# Patient Record
Sex: Female | Born: 1970 | Race: White | Hispanic: No | Smoking: Heavy tobacco smoker
Health system: Southern US, Community
[De-identification: ages and names within clinical notes are randomized; demographics above are authoritative.]

## PROBLEM LIST (undated history)

## (undated) DIAGNOSIS — L88 Pyoderma gangrenosum: Secondary | ICD-10-CM

## (undated) DIAGNOSIS — D649 Anemia, unspecified: Secondary | ICD-10-CM

## (undated) HISTORY — DX: Anemia, unspecified: D64.9

## (undated) HISTORY — PX: TUBAL LIGATION: SHX77

## (undated) HISTORY — PX: KNEE SURGERY: SHX244

---

## 2003-03-28 ENCOUNTER — Encounter: Payer: Self-pay | Admitting: Obstetrics and Gynecology

## 2003-03-28 ENCOUNTER — Inpatient Hospital Stay (HOSPITAL_COMMUNITY): Admission: AD | Admit: 2003-03-28 | Discharge: 2003-03-31 | Payer: Self-pay | Admitting: Obstetrics and Gynecology

## 2004-07-01 ENCOUNTER — Ambulatory Visit: Payer: Self-pay | Admitting: Family Medicine

## 2004-07-07 ENCOUNTER — Ambulatory Visit (HOSPITAL_COMMUNITY): Admission: RE | Admit: 2004-07-07 | Discharge: 2004-07-07 | Payer: Self-pay | Admitting: *Deleted

## 2004-07-07 ENCOUNTER — Ambulatory Visit: Payer: Self-pay | Admitting: Obstetrics & Gynecology

## 2004-07-21 ENCOUNTER — Ambulatory Visit: Payer: Self-pay | Admitting: *Deleted

## 2004-07-22 ENCOUNTER — Ambulatory Visit: Payer: Self-pay | Admitting: Family Medicine

## 2004-07-22 ENCOUNTER — Inpatient Hospital Stay (HOSPITAL_COMMUNITY): Admission: AD | Admit: 2004-07-22 | Discharge: 2004-07-22 | Payer: Self-pay | Admitting: Obstetrics & Gynecology

## 2004-07-28 ENCOUNTER — Ambulatory Visit: Payer: Self-pay | Admitting: *Deleted

## 2004-07-28 ENCOUNTER — Ambulatory Visit (HOSPITAL_COMMUNITY): Admission: RE | Admit: 2004-07-28 | Discharge: 2004-07-28 | Payer: Self-pay | Admitting: *Deleted

## 2004-08-04 ENCOUNTER — Ambulatory Visit: Payer: Self-pay | Admitting: *Deleted

## 2004-08-11 ENCOUNTER — Ambulatory Visit (HOSPITAL_COMMUNITY): Admission: RE | Admit: 2004-08-11 | Discharge: 2004-08-11 | Payer: Self-pay | Admitting: Obstetrics & Gynecology

## 2004-08-11 ENCOUNTER — Ambulatory Visit: Payer: Self-pay | Admitting: Obstetrics & Gynecology

## 2004-08-18 ENCOUNTER — Ambulatory Visit: Payer: Self-pay | Admitting: Family Medicine

## 2004-08-24 ENCOUNTER — Inpatient Hospital Stay (HOSPITAL_COMMUNITY): Admission: AD | Admit: 2004-08-24 | Discharge: 2004-08-27 | Payer: Self-pay | Admitting: *Deleted

## 2004-09-08 ENCOUNTER — Inpatient Hospital Stay: Admission: AD | Admit: 2004-09-08 | Discharge: 2004-09-08 | Payer: Self-pay | Admitting: *Deleted

## 2005-09-22 IMAGING — US US OB DETAIL+14 WK
1 series · 13 of 28 positions shown · non-contrast
Comparison: none

CLINICAL DATA: 29 week 6 day gestational age by LMP.  Advanced maternal age.

[Series 1: us ob detail+14 wk · 0.33mm/px · 13 of 419 slices shown]
[im 16/419]
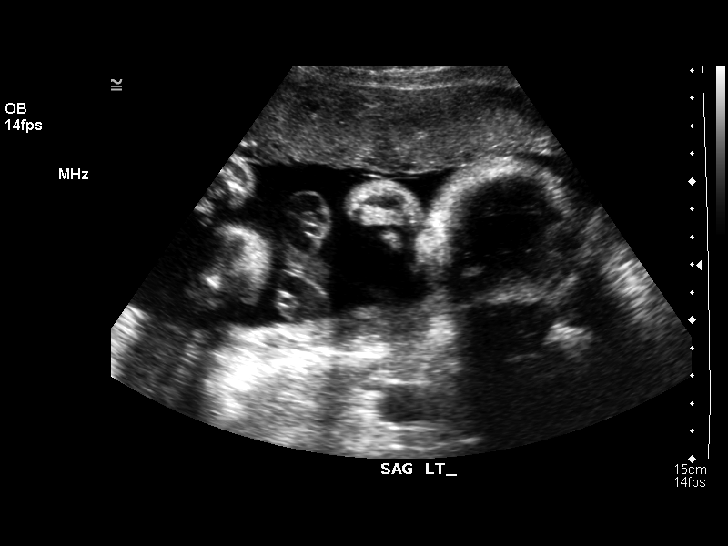
[im 47/419]
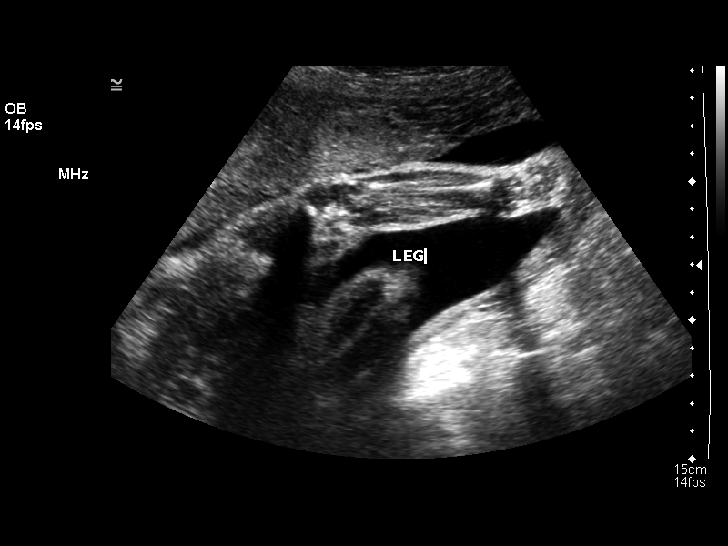
[im 78/419]
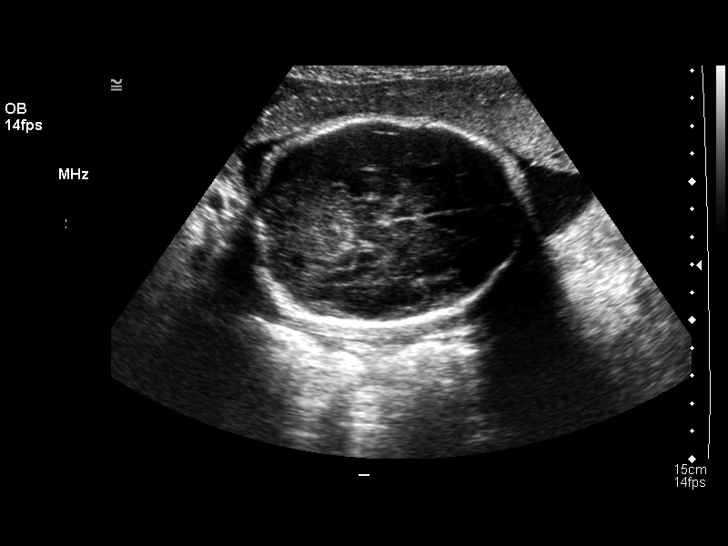
[im 109/419]
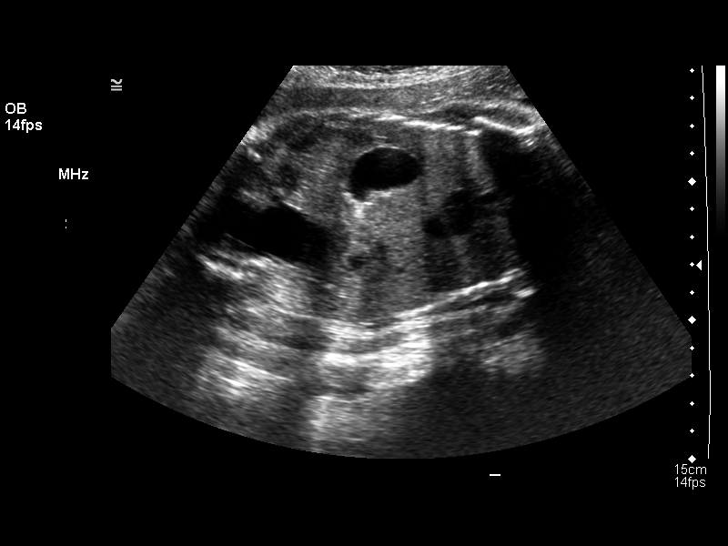
[im 140/419]
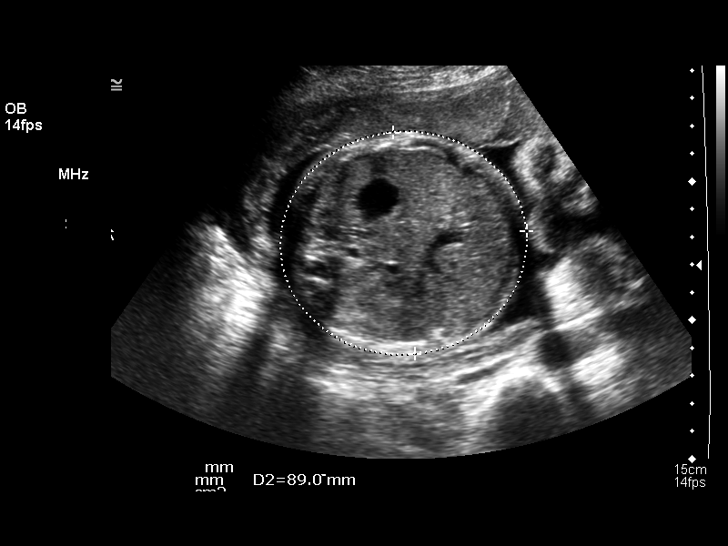
[im 171/419]
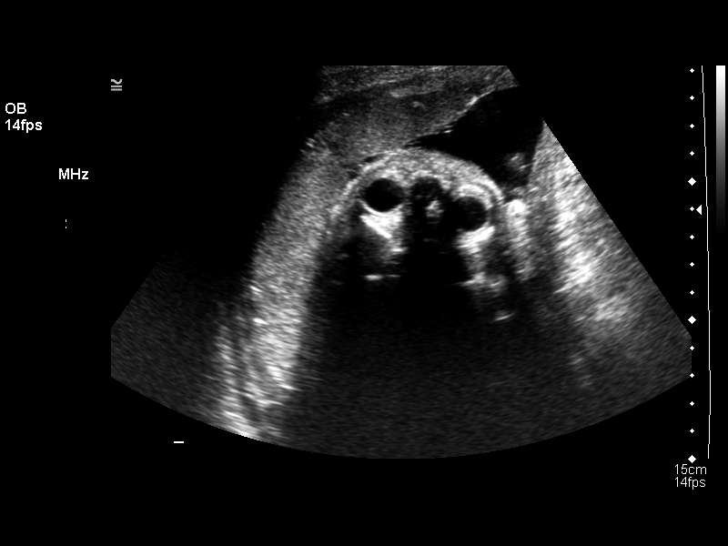
[im 217/419]
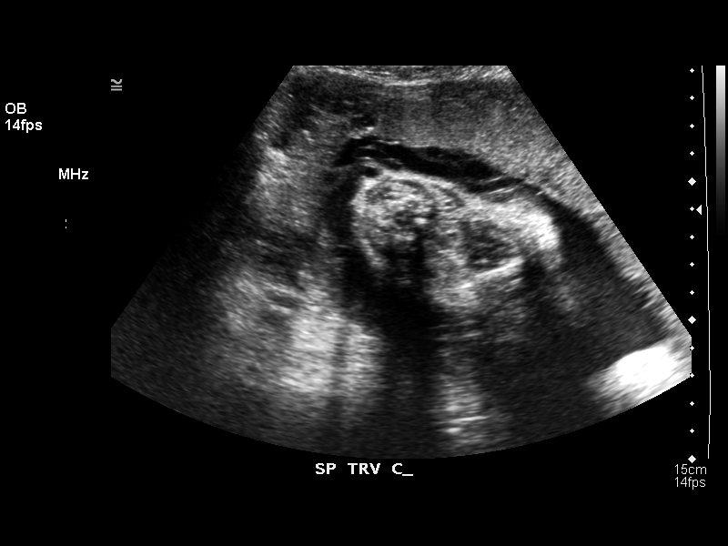
[im 248/419]
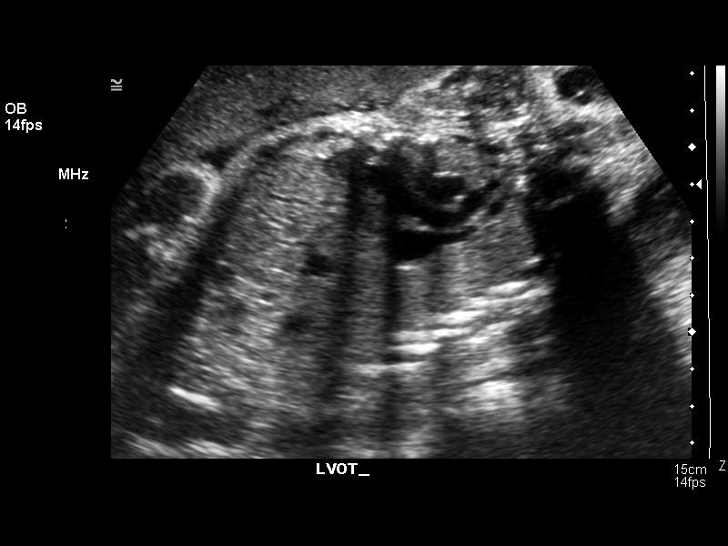
[im 279/419]
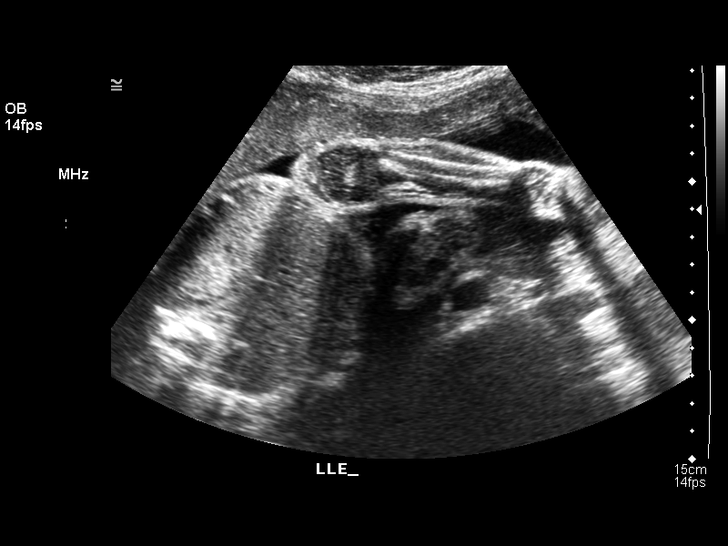
[im 310/419]
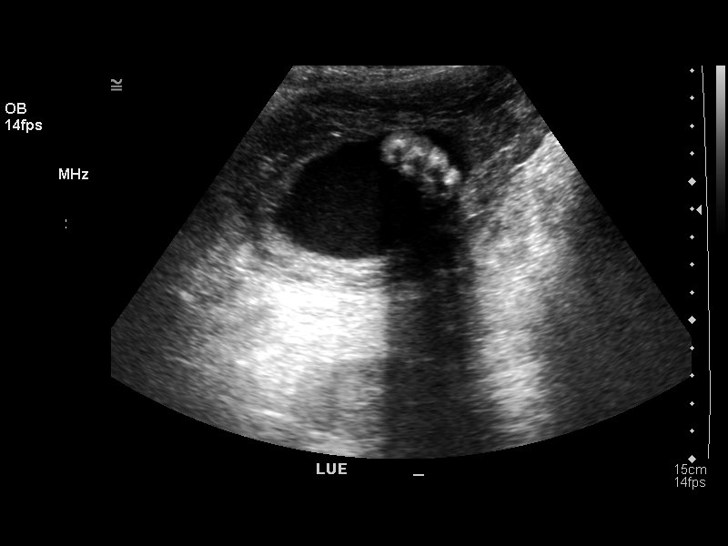
[im 341/419]
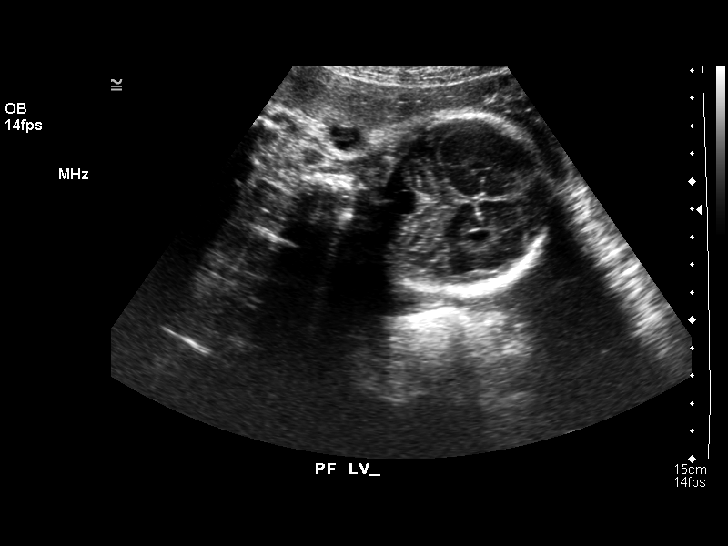
[im 372/419]
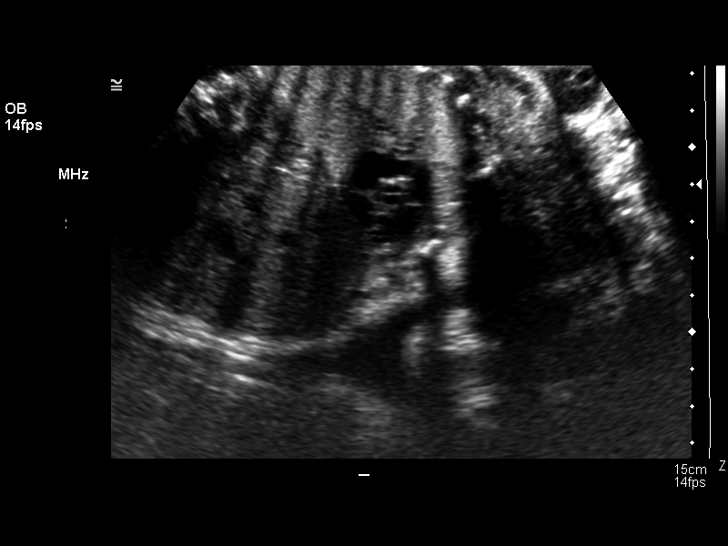
[im 403/419]
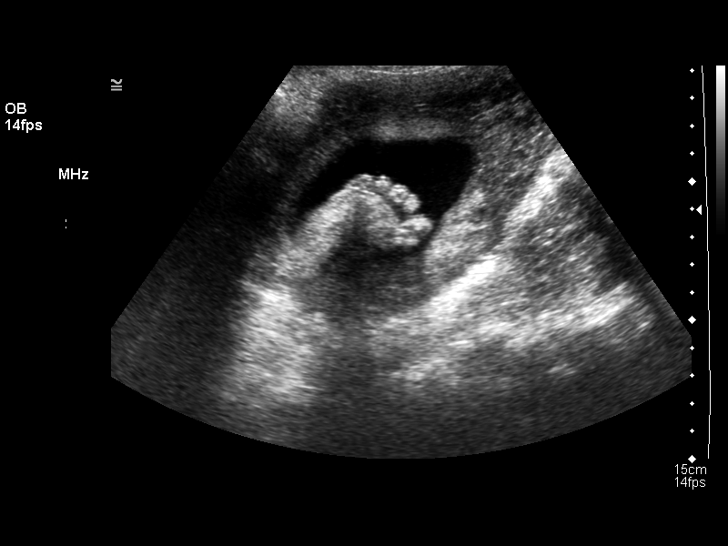

[13 of 28 positions shown; findings below may reference images not displayed]

DETAILED OBSTETRICAL ULTRASOUND:

Number of fetuses:  1
Heart rate:    141
Movement:    Yes
Breathing:    No
Presentation:   Cephalic
Placental Location:   Anterior
Placental Grade:    II
Placenta Previa:    No
Amniotic Fluid (Subjective):    Normal
Amniotic Fluid (Objective):    14.0 cm AFI (5th - 95th%ile = 9.0 - 23.4 cm for
30 wks)

BPD:       7.6 cm  30 w 5 d
HC:         29.4 cm   32 w 3 d
AC:         26.9 cm   31 w 1 d
FL:          5.8 cm  30 w 2 d
HL:          5.0 cm  29 w 2 d
Mean GA:      31 w 1 d

EFW:    5901 g (H) 75th - 90th%ile (2122 - 8501 g) For 30 wks

FETAL ANATOMY
Lateral Ventricle:      Visualized 
Thalamus/CSP:       Visualized 
Posterior Fossa:      Visualized 
Nuchal Region:       N/A
Spine:                   Visualized 
4-Chamber Heart:       Visualized 
Stomach:            Visualized 
3-Vessel Cord:     Visualized 
Abd Cord Insert:     Visualized 
Kidneys:             Visualized 
Bladder:              Visualized 
Extremities:          Visualized 

Additional Anatomy Visualized:   LVOT, RVOT, upper lip, orbits, profile,
diaphragm, heel, 5th digit, ductal arch, aortic arch, and male genitalia.  

Comment:  A nasal bone is visualized.  No sonographic markers for Down syndrome
are identified.

MATERNAL FINDINGS
Cervix:   3.8 cm Transabdominally
IMPRESSION: 1.  Single living intrauterine fetus with mean gestational age of 31 weeks 1 day
and sonographic EDC of 09/07/04.  This is slightly greater than 1 week ahead of
LMP, however initial estimates of gestational age in the third trimester are
less accurate.  

2.  No evidence of fetal anatomic abnormality.  No sonographic markers for Down
syndrome identified.

## 2005-10-13 IMAGING — US US OB FOLLOW-UP
1 series · 17 of 28 positions shown · non-contrast
Comparison: none

CLINICAL DATA: Decreased fetal movement.  Assess growth, AFI and BPP.

[Series 1: us ob re-eval · 17 of 56 slices shown]
[im 1/56]
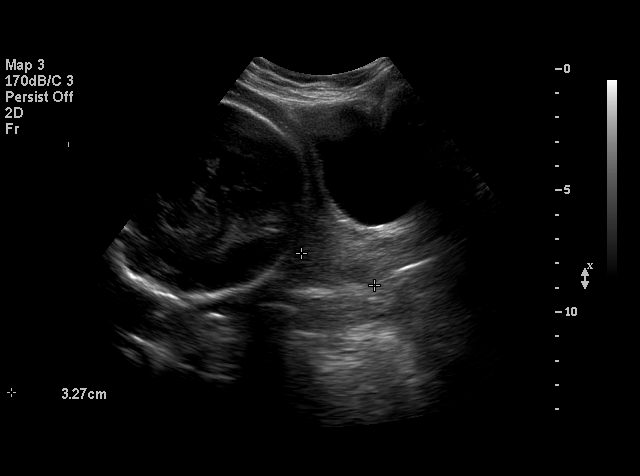
[im 5/56]
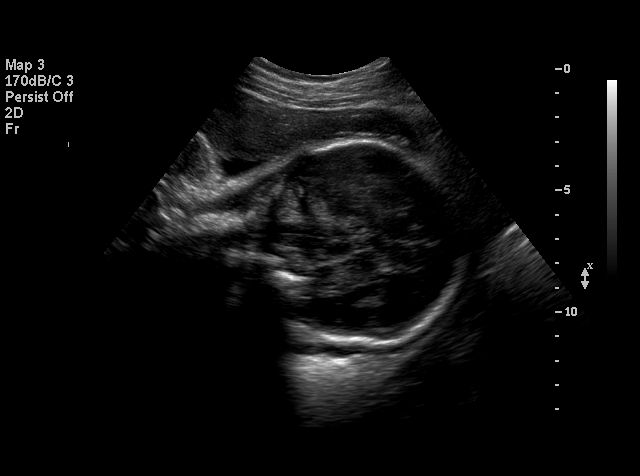
[im 9/56]
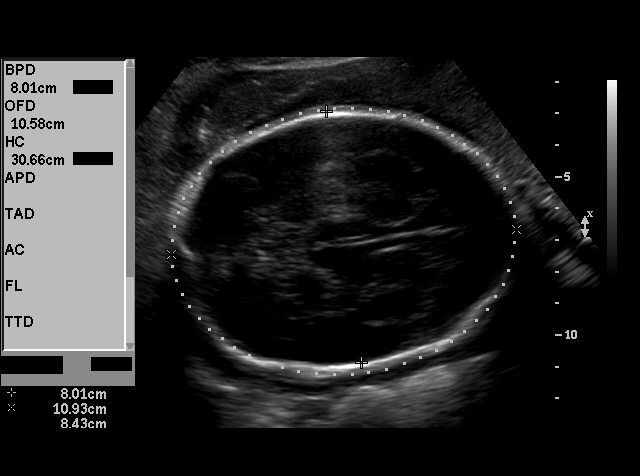
[im 11/56]
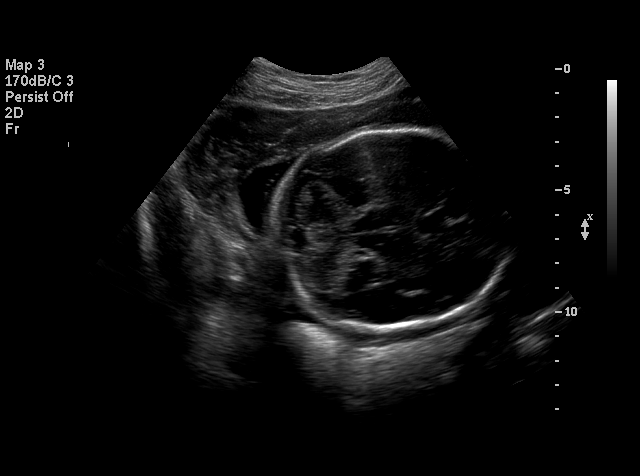
[im 15/56]
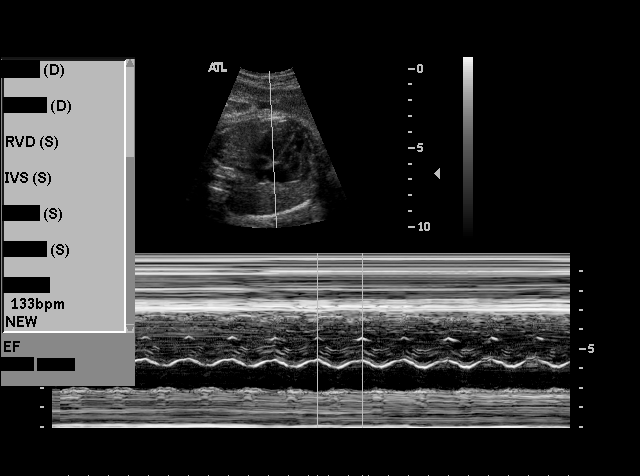
[im 19/56]
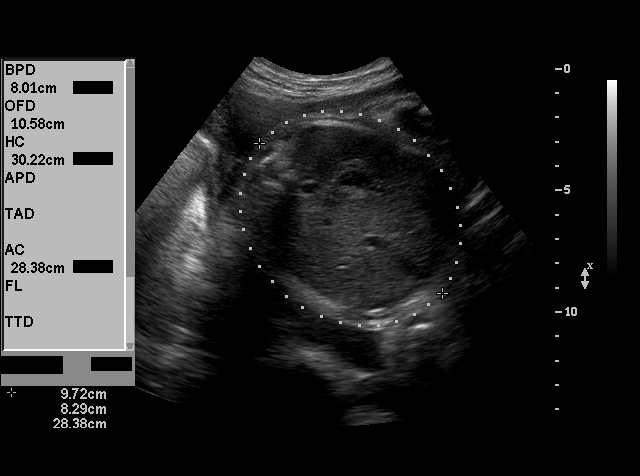
[im 21/56]
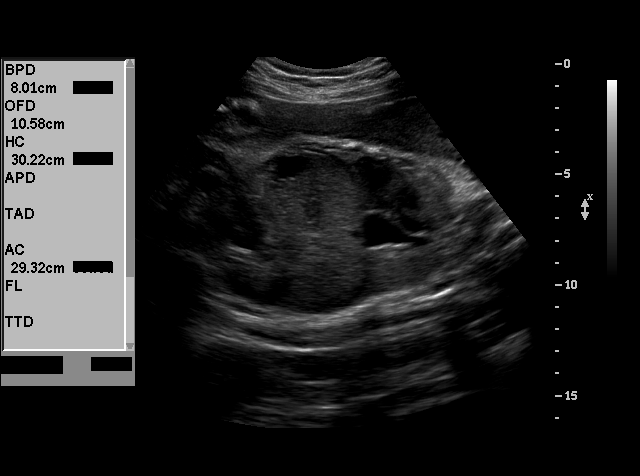
[im 25/56]
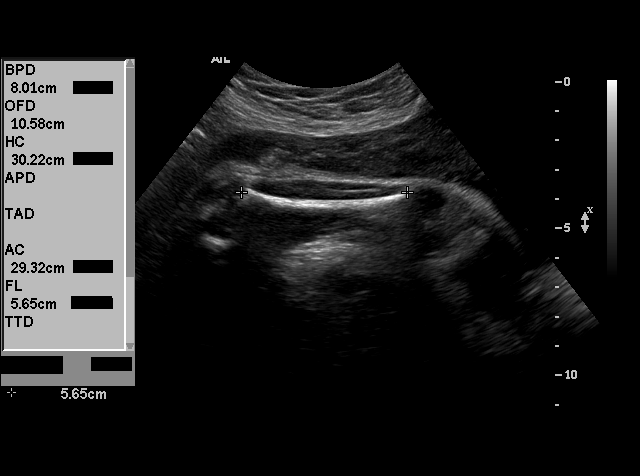
[im 29/56]
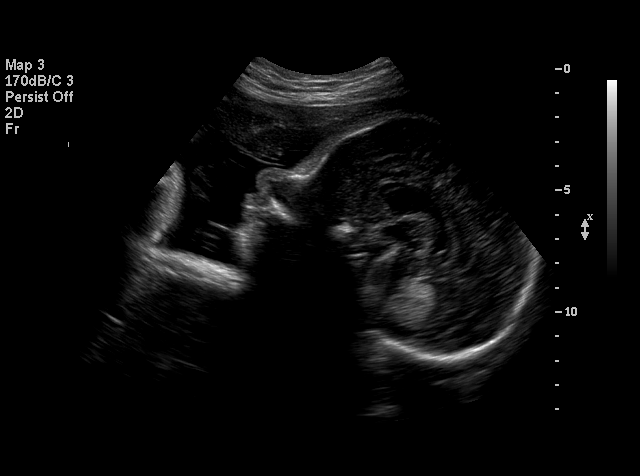
[im 31/56]
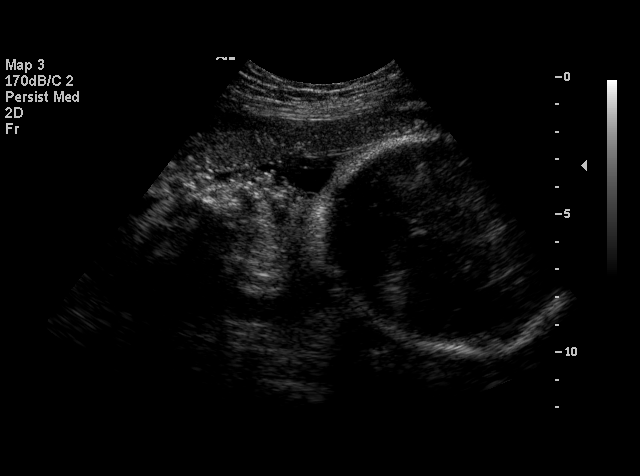
[im 35/56]
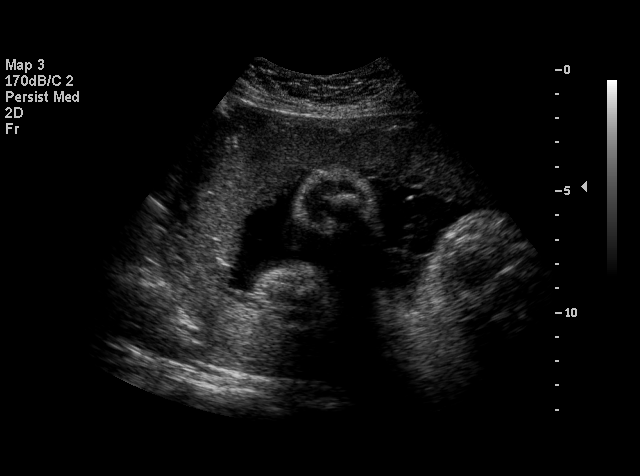
[im 37/56]
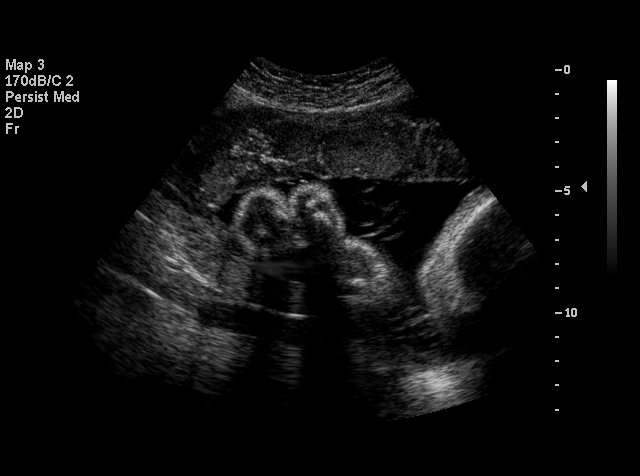
[im 41/56]
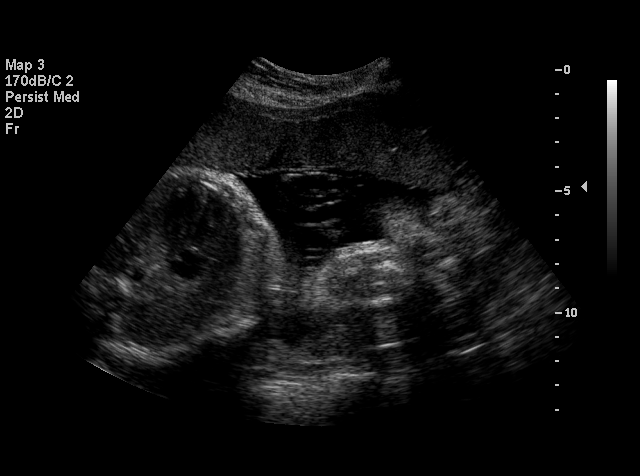
[im 45/56]
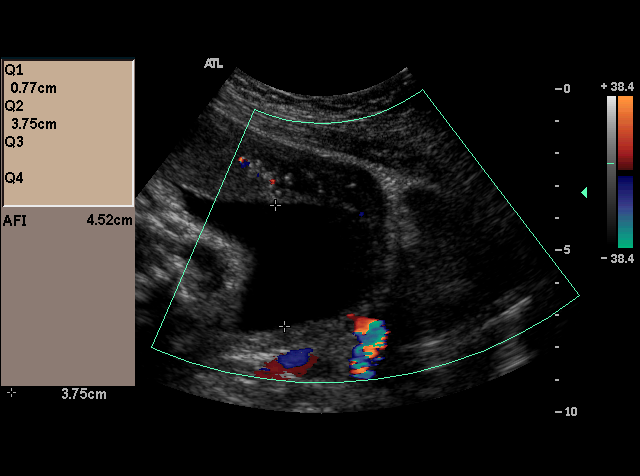
[im 47/56]
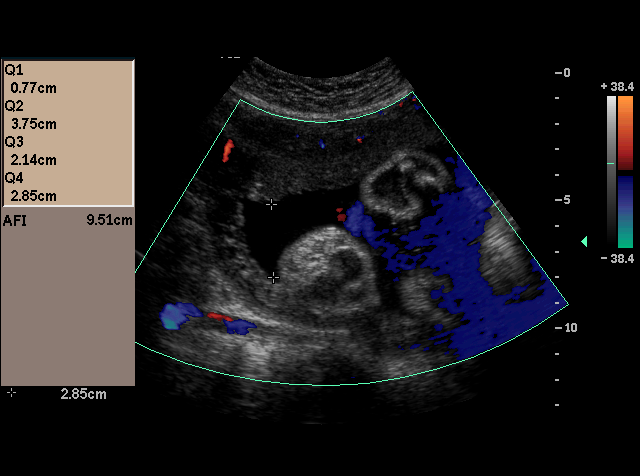
[im 51/56]
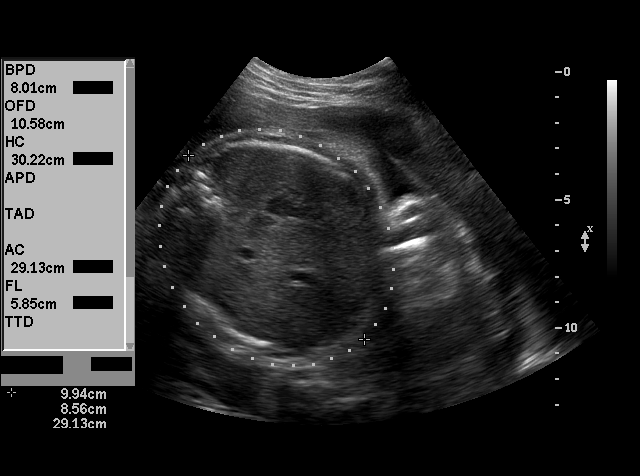
[im 56/56]
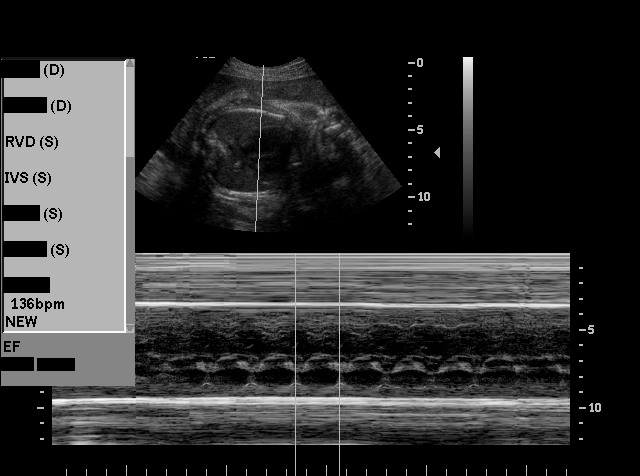

[17 of 28 positions shown; findings below may reference images not displayed]

OBSTETRICAL ULTRASOUND RE-EVALUATION:
 Number of Fetuses:  1
 Heart Rate:  133
 Movement:  Yes
 Breathing:  Yes
 Presentation:  Cephalic
 Placental Location:  Anterior
 Grade:  II
 Previa:  No
 Amniotic Fluid (subjective):  Normal
 Amniotic Fluid (objective):  10.4 cm AFI (5th -95th%ile = 8.3 – 24.5 cm for 33 wks)

 FETAL BIOMETRY
 BPD:  8.0 cm   32 w 1 d
 HC:  30.4 cm   33 w 6 d
 AC:  28.9 cm   33 w 0 d
 FL:   5.8 cm   30 w 3 d

 Mean GA:  32 w 3 d
 Assigned GA:  32 w 6 d

 EFW:  9803 g (H) 50th – 75th%ile (9893 – 1037 g) For 33 wks

 FETAL ANATOMY
 Lateral Ventricles:  Visualized 
 Thalami/CSP:  Visualized 
 Posterior Fossa:  Visualized 
 Nuchal Region:  Previously seen 
 Spine:  Previously seen 
 4 Chamber Heart on Left:  Visualized 
 Stomach on Left:  Visualized 
 3 Vessel Cord:  Previously seen 
 Cord Insertion Site:  Previously seen 
 Kidneys:  Visualized 
 Bladder:  Visualized   
 Extremities:  Previously seen 

 MATERNAL UTERINE AND ADNEXAL FINDINGS
 Cervix:  3.4 cm Transabdominally
 BIOPHYSICAL PROFILE

 Movement:  2    Time:  30 minutes
 Breathing:  2
 Tone:  2
 Amniotic Fluid:  2

 Total Score:  8
IMPRESSION: 1.  Single living intrauterine fetus in cephalic presentation with subjectively and quantitatively normal amniotic fluid volume.  Estimated mean gestational age by ultrasound today is 32 weeks 3 days which is well correlated with the reported assigned gestational age of 32 weeks 6 days.  Best estimated fetal weight has dropped from the 75th – 90th percentile to 50th – 75th percentile.  Repeat sonographic assessment in four weeks may prove helpful to exclude lagging fetal growth.
 2.  Biophysical profile score is [DATE] after 30 minutes of observation.

## 2005-10-27 IMAGING — US US OB FOLLOW-UP
1 series · 18 of 28 positions shown · non-contrast
Comparison: none

CLINICAL DATA: Size less than dates.  33-year-old.  G5 P4 with LMP of 12/11/03.

[Series 1: us ob re-eval · 18 of 35 slices shown]
[im 1/35]
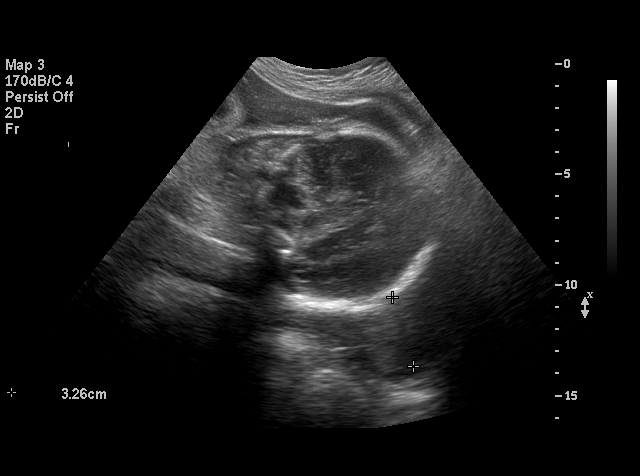
[im 3/35]
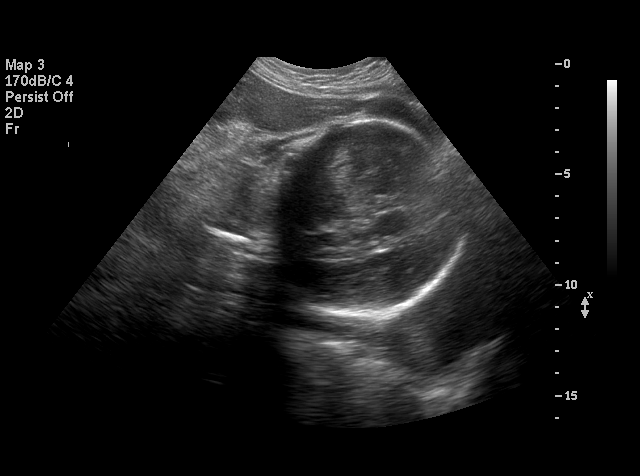
[im 4/35]
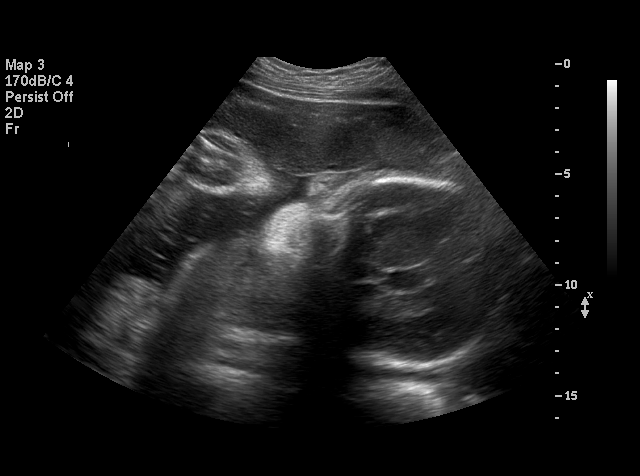
[im 7/35]
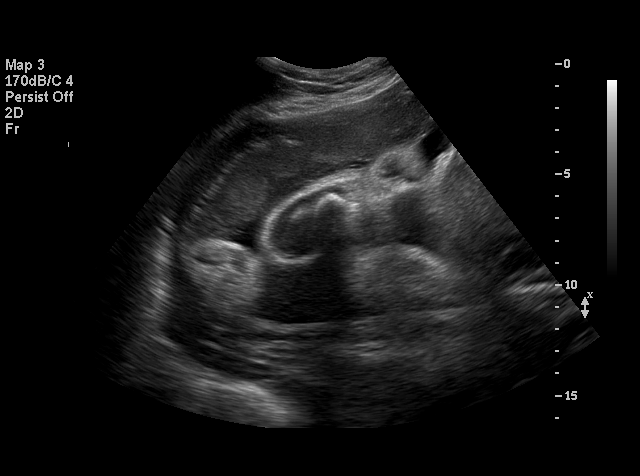
[im 9/35]
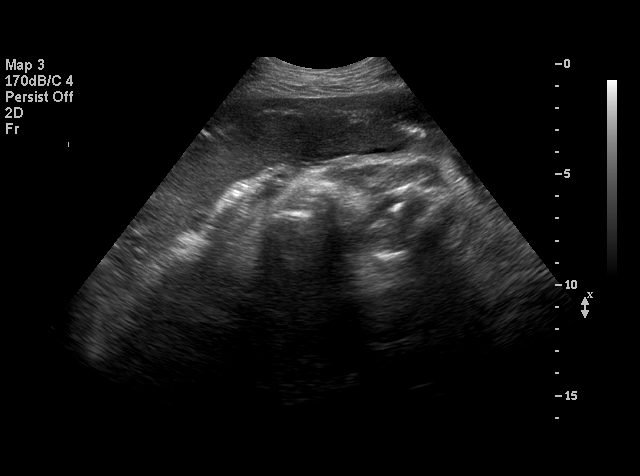
[im 11/35]
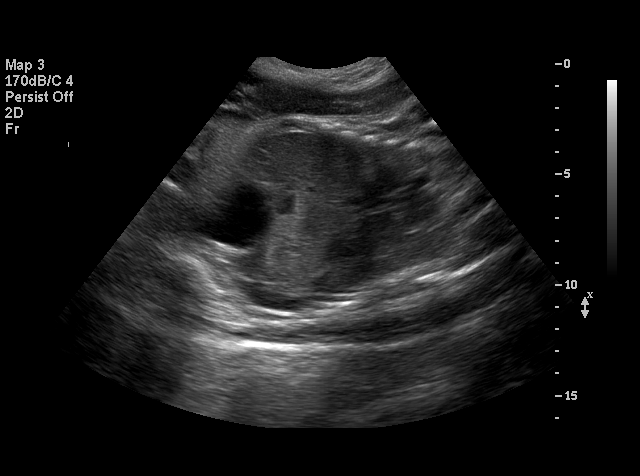
[im 13/35]
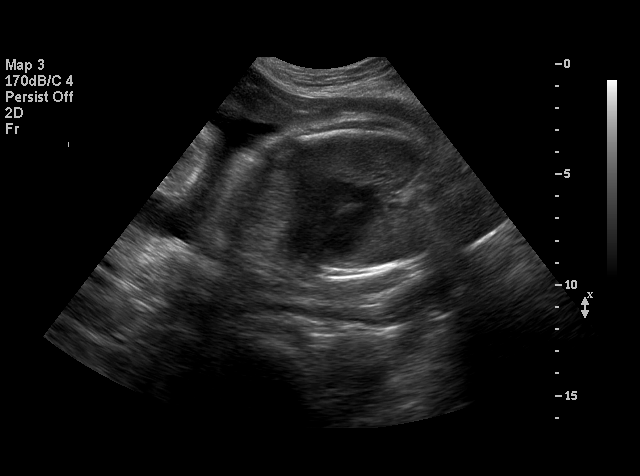
[im 14/35]
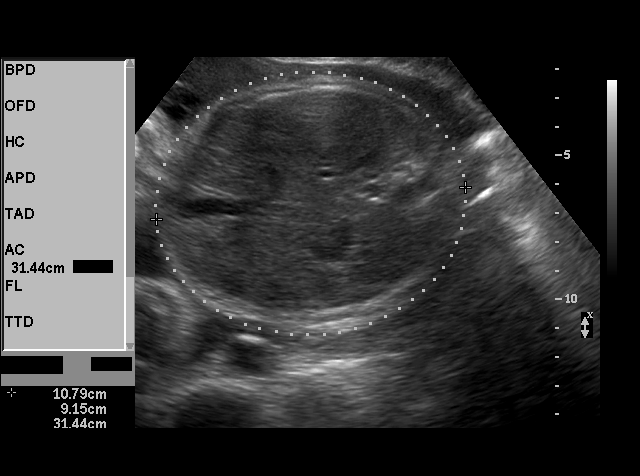
[im 17/35]
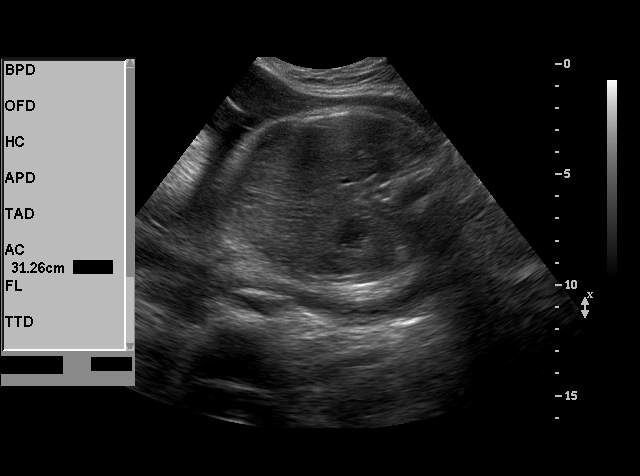
[im 18/35]
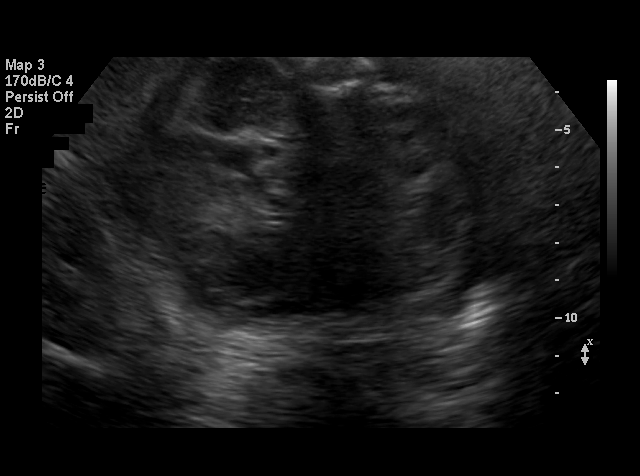
[im 21/35]
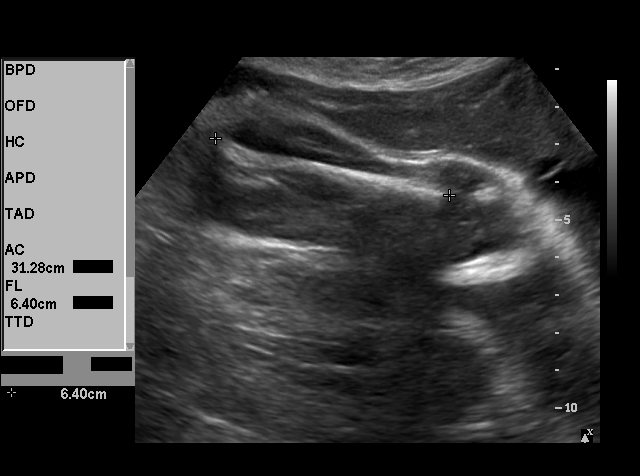
[im 22/35]
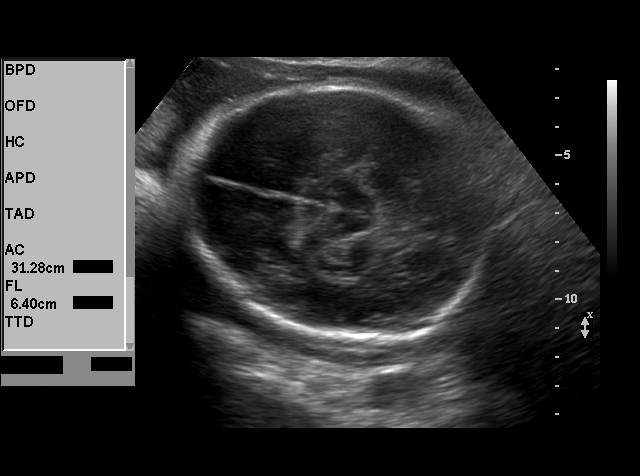
[im 24/35]
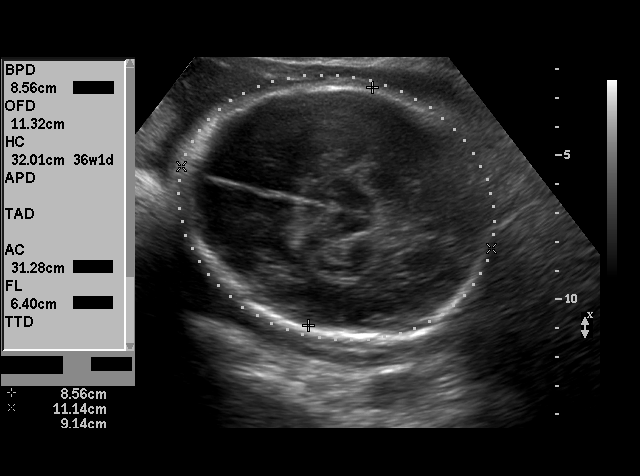
[im 27/35]
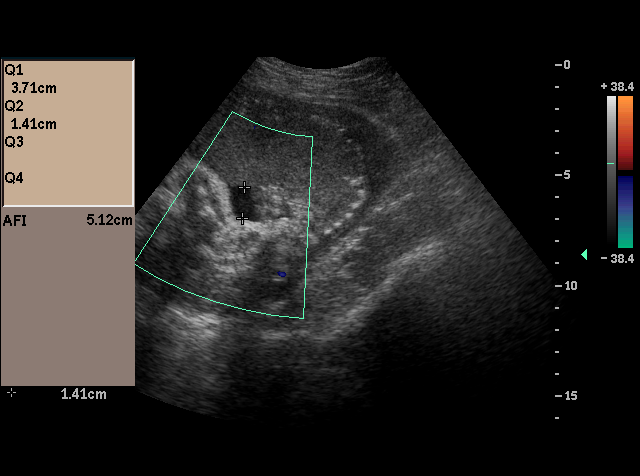
[im 28/35]
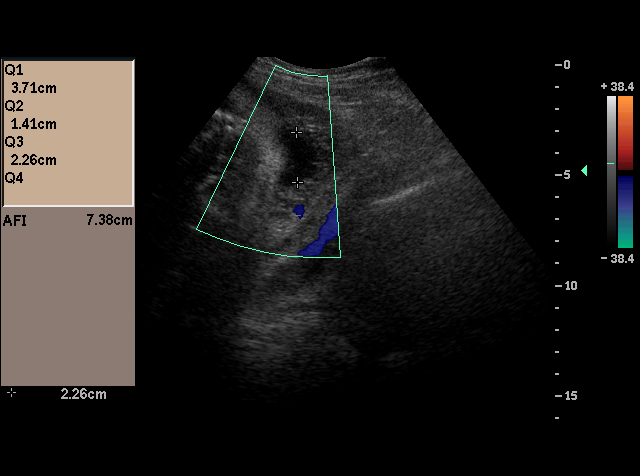
[im 31/35]
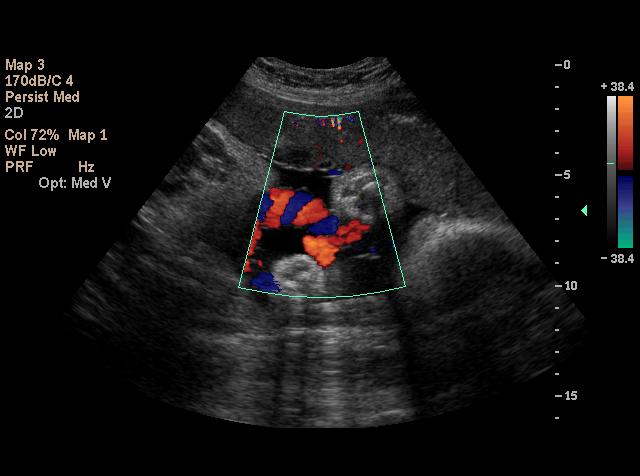
[im 32/35]
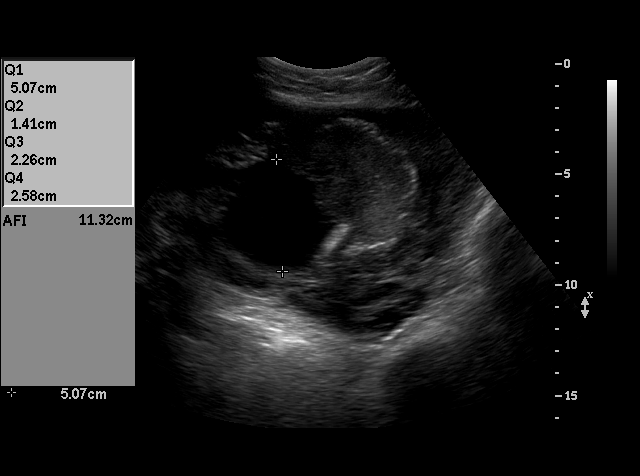
[im 35/35]
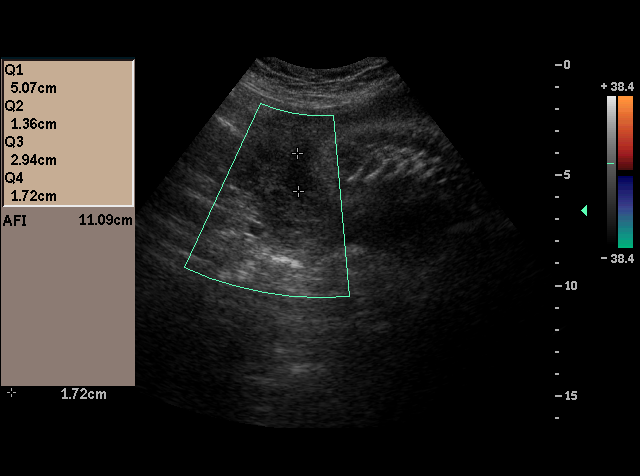

[18 of 28 positions shown; findings below may reference images not displayed]

OBSTETRICAL ULTRASOUND RE-EVALUATION:
 Number of Fetuses:  1
 Heart Rate:  136
 Movement:  Yes
 Breathing:  Yes
 Presentation:  Cephalic
 Placental Location:  Anterior
 Grade:  II
 Previa:  No
 Amniotic Fluid (subjective):  Normal
 Amniotic Fluid (objective):  11.1 cm AFI (5th -95th%ile = 7.9 – 24.9 cm for 35 wks)

 FETAL BIOMETRY
 BPD:  8.6 cm   34 w 4 d
 HC:  32.0 cm   36 w 1 d
 AC:  31.3 cm   35 w 2 d
 FL:   6.4 cm   33 w 1 d

 Mean GA:  34 w 6 d
 Assigned GA:  34 w 6 d

 EFW:  0553 g (H) 75th – 90th%[place] (5142 – 1110 g) For 35 wks

 FETAL ANATOMY
 Lateral Ventricles:  Visualized 
 Thalami/CSP:  Visualized 
 Posterior Fossa:  Previously seen 
 Nuchal Region:  Previously seen 
 Spine:  Previously seen 
 4 Chamber Heart on Left:  Previously seen 
 Stomach on Left:  Visualized 
 3 Vessel Cord:  Previously seen 
 Cord Insertion Site:  Previously seen 
 Kidneys:  Visualized 
 Bladder:  Visualized 
 Extremities:  Previously seen 

 Evaluation limited by:  Advanced gestational age 

 MATERNAL UTERINE AND ADNEXAL FINDINGS
 Cervix:  3.3 cm Transabdominally
IMPRESSION: Single living intrauterine fetus in cephalic presentation. Size and dates correlate well.  Amniotic fluid volume is within normal limits.

## 2010-09-11 ENCOUNTER — Encounter: Payer: Self-pay | Admitting: *Deleted

## 2011-08-23 DIAGNOSIS — L88 Pyoderma gangrenosum: Secondary | ICD-10-CM

## 2011-08-23 HISTORY — DX: Pyoderma gangrenosum: L88

## 2015-11-03 ENCOUNTER — Emergency Department (HOSPITAL_BASED_OUTPATIENT_CLINIC_OR_DEPARTMENT_OTHER)
Admission: EM | Admit: 2015-11-03 | Discharge: 2015-11-03 | Disposition: A | Discharge status: Home / Self Care | Payer: Medicaid Other | Attending: Emergency Medicine | Admitting: Emergency Medicine

## 2015-11-03 ENCOUNTER — Encounter (HOSPITAL_BASED_OUTPATIENT_CLINIC_OR_DEPARTMENT_OTHER): Payer: Self-pay | Admitting: Emergency Medicine

## 2015-11-03 DIAGNOSIS — Z79899 Other long term (current) drug therapy: Secondary | ICD-10-CM | POA: Diagnosis not present

## 2015-11-03 DIAGNOSIS — Z7952 Long term (current) use of systemic steroids: Secondary | ICD-10-CM | POA: Diagnosis not present

## 2015-11-03 DIAGNOSIS — Z880 Allergy status to penicillin: Secondary | ICD-10-CM | POA: Insufficient documentation

## 2015-11-03 DIAGNOSIS — Z91040 Latex allergy status: Secondary | ICD-10-CM | POA: Diagnosis not present

## 2015-11-03 DIAGNOSIS — L03113 Cellulitis of right upper limb: Secondary | ICD-10-CM

## 2015-11-03 DIAGNOSIS — F17200 Nicotine dependence, unspecified, uncomplicated: Secondary | ICD-10-CM | POA: Insufficient documentation

## 2015-11-03 DIAGNOSIS — M79641 Pain in right hand: Secondary | ICD-10-CM | POA: Diagnosis present

## 2015-11-03 HISTORY — DX: Pyoderma gangrenosum: L88

## 2015-11-03 LAB — BASIC METABOLIC PANEL
Anion gap: 7 (ref 5–15)
BUN: 12 mg/dL (ref 6–20)
CO2: 26 mmol/L (ref 22–32)
Calcium: 8.2 mg/dL — ABNORMAL LOW (ref 8.9–10.3)
Chloride: 106 mmol/L (ref 101–111)
Creatinine, Ser: 0.58 mg/dL (ref 0.44–1.00)
GFR calc Af Amer: >60 mL/min (ref >60)
GFR calc non Af Amer: >60 mL/min (ref >60)
Glucose, Bld: 80 mg/dL (ref 65–99)
Potassium: 3 mmol/L — ABNORMAL LOW (ref 3.5–5.1)
Sodium: 139 mmol/L (ref 135–145)

## 2015-11-03 LAB — CBC WITH DIFFERENTIAL/PLATELET
Basophils Absolute: 0 10*3/uL (ref 0.0–0.1)
Basophils Relative: 0 %
Eosinophils Absolute: 0 10*3/uL (ref 0.0–0.7)
Eosinophils Relative: 0 %
HCT: 39.3 % (ref 36.0–46.0)
Hemoglobin: 13 g/dL (ref 12.0–15.0)
Lymphocytes Relative: 15 %
Lymphs Abs: 1.7 10*3/uL (ref 0.7–4.0)
MCH: 31.1 pg (ref 26.0–34.0)
MCHC: 33.1 g/dL (ref 30.0–36.0)
MCV: 94 fL (ref 78.0–100.0)
Monocytes Absolute: 0.7 10*3/uL (ref 0.1–1.0)
Monocytes Relative: 6 %
Neutro Abs: 8.8 10*3/uL — ABNORMAL HIGH (ref 1.7–7.7)
Neutrophils Relative %: 79 %
Platelets: 107 10*3/uL — ABNORMAL LOW (ref 150–400)
RBC: 4.18 MIL/uL (ref 3.87–5.11)
RDW: 13.6 % (ref 11.5–15.5)
WBC: 11.2 10*3/uL — ABNORMAL HIGH (ref 4.0–10.5)

## 2015-11-03 MED ORDER — DIPHENHYDRAMINE HCL 50 MG/ML IJ SOLN
25.0000 mg | Freq: Once | INTRAMUSCULAR | Status: AC
  Administered 2015-11-03: 25 mg via INTRAVENOUS

## 2015-11-03 MED ORDER — FENTANYL CITRATE (PF) 100 MCG/2ML IJ SOLN
100.0000 ug | Freq: Once | INTRAMUSCULAR | Status: AC
  Administered 2015-11-03: 100 ug via INTRAVENOUS
  Filled 2015-11-03: qty 2

## 2015-11-03 MED ORDER — PROMETHAZINE HCL 25 MG PO TABS: 25.0000 mg | ORAL_TABLET | Freq: Four times a day (QID) | ORAL | Status: DC | PRN

## 2015-11-03 MED ORDER — HYDROCODONE-ACETAMINOPHEN 5-325 MG PO TABS: 1.0000 | ORAL_TABLET | Freq: Four times a day (QID) | ORAL | Status: DC | PRN

## 2015-11-03 MED ORDER — DIPHENHYDRAMINE HCL 50 MG/ML IJ SOLN
INTRAMUSCULAR | Status: AC
  Filled 2015-11-03: qty 1

## 2015-11-03 MED ORDER — DEXTROSE 5 % IV SOLN
1.0000 g | Freq: Once | INTRAVENOUS | Status: AC
  Administered 2015-11-03: 1 g via INTRAVENOUS
  Filled 2015-11-03 (×2): qty 10

## 2015-11-03 MED ORDER — VANCOMYCIN HCL IN DEXTROSE 1-5 GM/200ML-% IV SOLN
1000.0000 mg | Freq: Once | INTRAVENOUS | Status: AC
  Administered 2015-11-03: 1000 mg via INTRAVENOUS
  Filled 2015-11-03: qty 200

## 2015-11-03 MED ORDER — CEPHALEXIN 500 MG PO CAPS: 500.0000 mg | ORAL_CAPSULE | Freq: Four times a day (QID) | ORAL | Status: DC

## 2015-11-03 MED ORDER — SULFAMETHOXAZOLE-TRIMETHOPRIM 800-160 MG PO TABS: 1.0000 | ORAL_TABLET | Freq: Two times a day (BID) | ORAL | Status: AC

## 2015-11-03 MED ORDER — ONDANSETRON HCL 4 MG/2ML IJ SOLN
4.0000 mg | Freq: Once | INTRAMUSCULAR | Status: AC
  Administered 2015-11-03: 4 mg via INTRAVENOUS
  Filled 2015-11-03: qty 2

## 2015-11-03 MED FILL — CEPHALEXIN 500 MG CAPSULE: 500 | 10 days supply | Qty: 40 | Fill #0

## 2015-11-03 MED FILL — PROMETHAZINE 25 MG TABLET: 25 | 3 days supply | Qty: 10 | Fill #0

## 2015-11-03 MED FILL — SULFAMETHOXAZOLE-TMP DS TAB: 800-160 | 10 days supply | Qty: 20 | Fill #0

## 2015-11-03 MED FILL — HYDROCODON-APAP 5-325: 5-325 | 2 days supply | Qty: 15 | Fill #0

## 2015-11-03 NOTE — ED Notes (Signed)
Pt reports noticed reddness and edema approx 48 hrs ago that has double in size over the last 24 hrs states entire had is very painful

## 2015-11-03 NOTE — ED Provider Notes (Addendum)
CSN: [id number]     Arrival date & time 11/03/15  [id number] History   First MD Initiated Contact with Patient 11/03/15 0401     Chief Complaint  Patient presents with  . 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 She is followed [facility]. She is here with pain, swelling and erythema of the right hand that began approximately 48 hours ago. It is rapidly progressed and she has now having severe pain in that hand, worse with movement or palpation. Her right fourth finger is especially swollen with drainage of purulent material. She has had a subjective fever. She has been taking Bactrim without relief.  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  Past Medical History  Diagnosis Date  . Pyoderma gangrenosum    Past Surgical History  Procedure Laterality Date  . Tubal ligation    . Knee surgery     History reviewed. No pertinent family history. Social History  Substance Use Topics  . Smoking status: Heavy Tobacco Smoker -- 1.00 packs/day  . Smokeless tobacco: None  . Alcohol Use: No   OB History    No data available     Review of Systems  All other systems reviewed and are negative.   Allergies  Penicillins; Augmentin; Bee venom; Blue dyes (parenteral); Ciprofloxacin; and Latex  Home Medications   Prior to Admission medications   Medication Sig Start Date End Date Taking? Authorizing Provider  clobetasol cream (TEMOVATE) 0.05 % Apply 1 application topically 2 (two) times daily.   Yes Historical Provider, MD  cyanocobalamin 500 MCG tablet Take 500 mcg by mouth daily.   Yes Historical Provider, MD  esomeprazole (NEXIUM) 40 MG capsule Take 40 mg by mouth daily at 12 noon.   Yes Historical  Provider, MD  ferrous sulfate 325 (65 FE) MG EC tablet Take 325 mg by mouth 3 (three) times daily with meals.   Yes Historical Provider, MD  lidocaine (XYLOCAINE) 5 % ointment Apply 1 application topically 4 (four) times daily as needed.   Yes Historical Provider, MD  mycophenolate (CELLCEPT) 500 MG tablet Take by mouth 2 (two) times daily.   Yes Historical Provider, MD  prednisoLONE (ORAPRED ODT) 30 MG disintegrating tablet Take 30 mg by mouth daily.   Yes Historical Provider, MD  pregabalin (LYRICA) 75 MG capsule Take 75 mg by mouth 2 (two) times daily.   Yes Historical Provider, MD  vitamin C (ASCORBIC ACID) 500 MG tablet Take 500 mg by mouth daily.   Yes Historical Provider, MD  cephALEXin (KEFLEX) 500 MG capsule Take 1 capsule (500 mg total) by mouth 4 (four) times daily. 11/03/15   Geoffery Lyonsouglas Delo, MD  HYDROcodone-acetaminophen (NORCO) 5-325 MG tablet Take 1-2 tablets by mouth every 6 (six) hours as needed. 11/03/15   Geoffery Lyonsouglas Delo, MD  promethazine (PHENERGAN) 25 MG tablet Take 1 tablet (25 mg total) by mouth every 6 (six) hours as needed for nausea. 11/03/15   Geoffery Lyonsouglas Delo, MD  sulfamethoxazole-trimethoprim (BACTRIM DS,SEPTRA DS) 800-160 MG tablet Take 1 tablet by mouth 2 (two) times daily. 11/03/15 11/10/15  Geoffery Lyonsouglas Delo, MD   BP 116/65 mmHg  Pulse 83  Temp(Src) 97.9 F (36.6 C) (Oral)  Resp 18  Ht 5\' 5"  (1.651 m)  Wt 140 lb (63.504 kg)  BMI 23.30 kg/m2  SpO2 100%  LMP 10/20/2015   Physical Exam  General: Well-developed, well-nourished female in no acute distress; appearance consistent with age of record HENT: normocephalic; atraumatic Eyes: pupils equal, round and reactive to light; extraocular muscles intact Neck: supple Heart: regular rate and rhythm Lungs: clear to auscultation bilaterally Abdomen: soft; nondistended; nontender; no masses or hepatosplenomegaly; bowel sounds present Extremities: Erythema, tenderness and swelling of the right hand with decreased range of motion due  to pain:   Neurologic: Awake, alert and oriented; motor function intact in all extremities and symmetric; no facial droop Skin: Warm and dry Psychiatric: Normal mood and affect    ED Course  Procedures (including critical care time)   MDM  Nursing notes and vitals signs, including pulse oximetry, reviewed.  Summary of this visit's results, reviewed by myself:  Labs:  No results found for this or any previous visit (from the past 24 hour(s)).  4:15 AM Rocephin and vancomycin IV ordered.   5:45 AM Charge nurse alerted that patient's orders have not been carried out.    Paula Libra, MD 11/03/15 1610  Paula Libra, MD 11/04/15 [id number]

## 2015-11-03 NOTE — ED Provider Notes (Signed)
Care assumed from Dr. Read DriversMolpus at shift change. Patient with history of autoimmune disease who presents with infection in her finger. According to the patient, her finger has doubled in size in the past 24 hours. She states that this occurs with some regularity and she is normally treated with Rocephin. Rocephin was given in the ER and she will be discharged with Keflex and Bactrim. She tells me she has a follow-up appointment tomorrow with her dermatologist who really takes care of this condition.  The patient was recommended admission, however she is refusing this and is adamant that she can be treated as an outpatient. Myself and Dr. Read DriversMolpus have expressed concerns with outpatient therapy, however the patient prefers to go home.  Debra Lyonsouglas Clem Wisenbaker, MD 11/03/15 0830

## 2015-11-03 NOTE — Discharge Instructions (Signed)
Keflex and Bactrim as prescribed.  Hydrocodone as prescribed as needed for pain, and Phenergan as prescribed as needed for nausea.  Be sure to follow-up with your dermatologist tomorrow as scheduled, and return to the ER if symptoms significantly worsen or change.   Cellulitis Cellulitis is an infection of the skin and the tissue beneath it. The infected area is usually red and tender. Cellulitis occurs most often in the arms and lower legs.  CAUSES  Cellulitis is caused by bacteria that enter the skin through cracks or cuts in the skin. The most common types of bacteria that cause cellulitis are staphylococci and streptococci. SIGNS AND SYMPTOMS   Redness and warmth.  Swelling.  Tenderness or pain.  Fever. DIAGNOSIS  Your health care provider can usually determine what is wrong based on a physical exam. Blood tests may also be done. TREATMENT  Treatment usually involves taking an antibiotic medicine. HOME CARE INSTRUCTIONS   Take your antibiotic medicine as directed by your health care provider. Finish the antibiotic even if you start to feel better.  Keep the infected arm or leg elevated to reduce swelling.  Apply a warm cloth to the affected area up to 4 times per day to relieve pain.  Take medicines only as directed by your health care provider.  Keep all follow-up visits as directed by your health care provider. SEEK MEDICAL CARE IF:   You notice red streaks coming from the infected area.  Your red area gets larger or turns dark in color.  Your bone or joint underneath the infected area becomes painful after the skin has healed.  Your infection returns in the same area or another area.  You notice a swollen bump in the infected area.  You develop new symptoms.  You have a fever. SEEK IMMEDIATE MEDICAL CARE IF:   You feel very sleepy.  You develop vomiting or diarrhea.  You have a general ill feeling (malaise) with muscle aches and pains.   This  information is not intended to replace advice given to you by your health care provider. Make sure you discuss any questions you have with your health care provider.   Document Released: 05/18/2005 Document Revised: 04/29/2015 Document Reviewed: 10/24/2011 Elsevier Interactive Patient Education [employer]2016 [employer]vier Inc.

## 2015-11-06 LAB — CULTURE, ROUTINE-ABSCESS

## 2015-11-07 ENCOUNTER — Telehealth (HOSPITAL_BASED_OUTPATIENT_CLINIC_OR_DEPARTMENT_OTHER): Payer: Self-pay | Admitting: Emergency Medicine

## 2015-11-07 NOTE — Telephone Encounter (Signed)
Post ED Visit - Positive Culture Follow-up  Culture report reviewed by antimicrobial stewardship pharmacist:  []  Enzo BiNathan Batchelder, Pharm.D. []  Celedonio MiyamotoJeremy Frens, Pharm.D., BCPS [x]  Garvin FilaMike Maccia, Pharm.D. []  Georgina PillionElizabeth Martin, Pharm.D., BCPS []  NixonMinh Pham, Vermont[place]arm.D., BCPS, AAHIVP []  Estella HuskMichelle Turner, Pharm.D., BCPS, AAHIVP []  Cassie Stewart, Pharm.D. []  Sherle Poeob Vincent, [address]Pharm[address]  Positive abcess culture Staph Treated with cephalexin,  Bactrim DS, organism sensitive to the same and no further patient follow-up is required at this time.  Debra MullMiller, Debra Walter 11/07/2015, 9:53 AM

## 2016-06-07 ENCOUNTER — Ambulatory Visit: Payer: Medicaid Other | Attending: Family Medicine | Admitting: Family Medicine

## 2016-06-07 ENCOUNTER — Encounter: Payer: Self-pay | Admitting: Family Medicine

## 2016-06-07 VITALS — BP 148/80 | HR 82 | Temp 99.1°F | Ht 65.0 in | Wt 170.6 lb

## 2016-06-07 DIAGNOSIS — F17210 Nicotine dependence, cigarettes, uncomplicated: Secondary | ICD-10-CM | POA: Insufficient documentation

## 2016-06-07 DIAGNOSIS — N644 Mastodynia: Secondary | ICD-10-CM

## 2016-06-07 DIAGNOSIS — Z9889 Other specified postprocedural states: Secondary | ICD-10-CM | POA: Insufficient documentation

## 2016-06-07 DIAGNOSIS — I10 Essential (primary) hypertension: Secondary | ICD-10-CM | POA: Insufficient documentation

## 2016-06-07 DIAGNOSIS — L88 Pyoderma gangrenosum: Secondary | ICD-10-CM

## 2016-06-07 DIAGNOSIS — Z114 Encounter for screening for human immunodeficiency virus [HIV]: Secondary | ICD-10-CM

## 2016-06-07 DIAGNOSIS — D509 Iron deficiency anemia, unspecified: Secondary | ICD-10-CM

## 2016-06-07 DIAGNOSIS — F17200 Nicotine dependence, unspecified, uncomplicated: Secondary | ICD-10-CM | POA: Insufficient documentation

## 2016-06-07 LAB — CBC
HCT: 45.3 % — ABNORMAL HIGH (ref 35.0–45.0)
Hemoglobin: 15.1 g/dL (ref 11.7–15.5)
MCH: 30.8 pg (ref 27.0–33.0)
MCHC: 33.3 g/dL (ref 32.0–36.0)
MCV: 92.4 fL (ref 80.0–100.0)
Platelets: 130 10*3/uL — ABNORMAL LOW (ref 140–400)
RBC: 4.9 MIL/uL (ref 3.80–5.10)
RDW: 12.6 % (ref 11.0–15.0)
WBC: 6.9 10*3/uL (ref 3.8–10.8)

## 2016-06-07 LAB — COMPLETE METABOLIC PANEL WITH GFR
ALT: 14 U/L (ref 6–29)
AST: 15 U/L (ref 10–35)
Albumin: 3.9 g/dL (ref 3.6–5.1)
Alkaline Phosphatase: 90 U/L (ref 33–115)
BUN: 8 mg/dL (ref 7–25)
CO2: 22 mmol/L (ref 20–31)
Calcium: 9.4 mg/dL (ref 8.6–10.2)
Chloride: 108 mmol/L (ref 98–110)
Creat: 0.82 mg/dL (ref 0.50–1.10)
GFR, Est African American: >89 mL/min (ref >=60)
GFR, Est Non African American: 86 mL/min (ref >=60)
Glucose, Bld: 79 mg/dL (ref 65–99)
Potassium: 4.3 mmol/L (ref 3.5–5.3)
Sodium: 138 mmol/L (ref 135–146)
Total Bilirubin: 0.6 mg/dL (ref 0.2–1.2)
Total Protein: 6.6 g/dL (ref 6.1–8.1)

## 2016-06-07 LAB — LIPID PANEL
Cholesterol: 129 mg/dL (ref 125–200)
HDL: 31 mg/dL — ABNORMAL LOW (ref >=46)
LDL Cholesterol: 53 mg/dL (ref <130)
Total CHOL/HDL Ratio: 4.2 Ratio (ref <=5.0)
Triglycerides: 223 mg/dL — ABNORMAL HIGH (ref <150)
VLDL: 45 mg/dL — ABNORMAL HIGH (ref <30)

## 2016-06-07 LAB — POCT GLYCOSYLATED HEMOGLOBIN (HGB A1C): Hemoglobin A1C: 5

## 2016-06-07 MED ORDER — GABAPENTIN 300 MG PO CAPS
ORAL_CAPSULE | ORAL | 11 refills | Status: DC
Start: 2016-06-07 — End: 2018-07-03

## 2016-06-07 MED ORDER — DULOXETINE HCL 30 MG PO CPEP: 30.0000 mg | ORAL_CAPSULE | Freq: Every day | ORAL | 11 refills | Status: DC

## 2016-06-07 MED ORDER — MYCOPHENOLATE MOFETIL 500 MG PO TABS: 500.0000 mg | ORAL_TABLET | Freq: Two times a day (BID) | ORAL | 11 refills | Status: DC

## 2016-06-07 MED ORDER — FERROUS SULFATE 325 (65 FE) MG PO TBEC: 325.0000 mg | DELAYED_RELEASE_TABLET | Freq: Three times a day (TID) | ORAL | 11 refills | Status: DC

## 2016-06-07 MED FILL — GABAPENTIN 300 MG CAPSULE: 300 | 33 days supply | Qty: 90 | Fill #0

## 2016-06-07 MED FILL — DULoxetine HCL 30 MG CPEP: 30 | 30 days supply | Qty: 30 | Fill #0

## 2016-06-07 MED FILL — MYCOPHENOLATE 500 MG TABLET: 500 | 30 days supply | Qty: 60 | Fill #0

## 2016-06-07 NOTE — Progress Notes (Signed)
Pt has a autoimmune disease, wants to talk about fibromyalgia.  Pt got flu shot in lobby.

## 2016-06-07 NOTE — Progress Notes (Signed)
LOGO@  Subjective:  Patient ID: Debra Walter, female    DOB: 06/27/1971  Age: 45 y.o. MRN: [id number]  CC: Establish Care   HPI Debra BuddShonda Walter presents for   1. Pyoderma gangrenosum: dx in 2013. Treated by dermatology. Has extensive scars on arms, lower abdomen and L leg. No oral lesions or ocular lesions. No fever or chills. Takes cellcept. Request refill. Uninsured.   2. HTN: has been on BP med in the past. No HA, CP or SOB. No leg swelling. Current smoker.   3. Breast pains: pains in L breast. No lump, rash or skin changes. Has not had screening mammogram. Also over due for pap smear.   Past Medical History:  Diagnosis Date  . Anemia   . Pyoderma gangrenosum 08/2011    Past Surgical History:  Procedure Laterality Date  . KNEE SURGERY    . TUBAL LIGATION      No family history on file.  Social History  Substance Use Topics  . Smoking status: Heavy Tobacco Smoker    Packs/day: 1.00  . Smokeless tobacco: Not on file  . Alcohol use No    ROS Review of Systems  Constitutional: Negative for chills and fever.  Eyes: Negative for visual disturbance.  Respiratory: Negative for shortness of breath.   Cardiovascular: Negative for chest pain.  Gastrointestinal: Negative for abdominal pain and blood in stool.  Musculoskeletal: Positive for arthralgias. Negative for back pain.  Skin: Negative for rash.  Allergic/Immunologic: Negative for immunocompromised state.  Hematological: Negative for adenopathy. Does not bruise/bleed easily.  Psychiatric/Behavioral: Positive for dysphoric mood. Negative for suicidal ideas.    Objective:   Today's Vitals: BP (!) 148/80 (BP Location: Right Arm, Patient Position: Sitting, Cuff Size: Small)   Pulse 82   Temp 99.1 F (37.3 C) (Oral)   Ht 5\' 5"  (1.651 m)   Wt 170 lb 9.6 oz (77.4 kg)   LMP 04/22/2016   SpO2 99%   BMI 28.39 kg/m   Physical Exam  Constitutional: She is oriented to person, place, and time. She appears  well-developed and well-nourished. No distress.  HENT:  Head: Normocephalic and atraumatic.  Mouth/Throat: Oropharynx is clear and moist.  Eyes: Conjunctivae are normal. Pupils are equal, round, and reactive to light.  Cardiovascular: Normal rate, regular rhythm, normal heart sounds and intact distal pulses.   Pulmonary/Chest: Effort normal and breath sounds normal.  Musculoskeletal: She exhibits no edema.  Neurological: She is alert and oriented to person, place, and time.  Skin: Skin is warm and dry. No rash noted.     Psychiatric: She has a normal mood and affect.    Assessment & Plan:   Problem List Items Addressed This Visit      Unprioritized   Pyoderma gangrenosum   Relevant Medications   DULoxetine (CYMBALTA) 30 MG capsule   gabapentin (NEURONTIN) 300 MG capsule   mycophenolate (CELLCEPT) 500 MG tablet   Other Relevant Orders   Ambulatory referral to Dermatology   Ambulatory referral to Rheumatology   HgB A1c   Iron deficiency anemia - Primary   Relevant Medications   ferrous sulfate 325 (65 FE) MG EC tablet   Other Relevant Orders   CBC   Hypertension   Relevant Orders   Lipid Panel   COMPLETE METABOLIC PANEL WITH GFR   Current smoker    Other Visit Diagnoses   None.     Outpatient Encounter Prescriptions as of 06/07/2016  Medication Sig  . mycophenolate (CELLCEPT) 500 MG tablet Take  by mouth 2 (two) times daily.  . cephALEXin (KEFLEX) 500 MG capsule Take 1 capsule (500 mg total) by mouth 4 (four) times daily. (Patient not taking: Reported on 06/07/2016)  . clobetasol cream (TEMOVATE) 0.05 % Apply 1 application topically 2 (two) times daily.  . cyanocobalamin 500 MCG tablet Take 500 mcg by mouth daily.  Marland Kitchen esomeprazole (NEXIUM) 40 MG capsule Take 40 mg by mouth daily at 12 noon.  . ferrous sulfate 325 (65 FE) MG EC tablet Take 325 mg by mouth 3 (three) times daily with meals.  Marland Kitchen HYDROcodone-acetaminophen (NORCO) 5-325 MG tablet Take 1-2 tablets by mouth  every 6 (six) hours as needed. (Patient not taking: Reported on 06/07/2016)  . lidocaine (XYLOCAINE) 5 % ointment Apply 1 application topically 4 (four) times daily as needed.  . prednisoLONE (ORAPRED ODT) 30 MG disintegrating tablet Take 30 mg by mouth daily.  . pregabalin (LYRICA) 75 MG capsule Take 75 mg by mouth 2 (two) times daily.  . promethazine (PHENERGAN) 25 MG tablet Take 1 tablet (25 mg total) by mouth every 6 (six) hours as needed for nausea. (Patient not taking: Reported on 06/07/2016)  . vitamin C (ASCORBIC ACID) 500 MG tablet Take 500 mg by mouth daily.   No facility-administered encounter medications on file as of 06/07/2016.     Follow-up: Return in about 3 weeks (around 06/28/2016) for pap and breast exam .    Dessa Phi MD

## 2016-06-07 NOTE — Patient Instructions (Addendum)
Debra SnideShonda was seen today for establish care.  Diagnoses and all orders for this visit:  Iron deficiency anemia, unspecified iron deficiency anemia type -     ferrous sulfate 325 (65 FE) MG EC tablet; Take 1 tablet (325 mg total) by mouth 3 (three) times daily with meals. -     CBC  Pyoderma gangrenosum -     Ambulatory referral to Dermatology -     DULoxetine (CYMBALTA) 30 MG capsule; Take 1 capsule (30 mg total) by mouth daily. -     gabapentin (NEURONTIN) 300 MG capsule; Take 300 mg at night for 3 days, then twice daily for 3 days, then three times daily -     Ambulatory referral to Rheumatology -     HgB A1c -     mycophenolate (CELLCEPT) 500 MG tablet; Take 1 tablet (500 mg total) by mouth 2 (two) times daily.  Hypertension, unspecified type -     Lipid Panel -     COMPLETE METABOLIC PANEL WITH GFR  Current smoker  I recommend nicotine patch, this is available at [employer] over the counter.  Smoking cessation support: smoking cessation hotline: 1-800-QUIT-NOW.  Smoking cessation classes are available through [facility]. Call [phone] or visit our website at [email or web address].  F/u in 2-3 weeks for pap with breast exam and BP check   Dr. Armen PickupFunches

## 2016-06-08 LAB — HIV ANTIBODY (ROUTINE TESTING W REFLEX): HIV 1&2 Ab, 4th Generation: NONREACTIVE

## 2016-06-09 ENCOUNTER — Telehealth: Payer: Self-pay

## 2016-06-09 NOTE — Telephone Encounter (Signed)
Contacted pt to go over lab results pt did not answer and was unable to lvm 

## 2016-06-10 ENCOUNTER — Encounter: Payer: Self-pay | Admitting: Family Medicine

## 2016-06-10 DIAGNOSIS — N644 Mastodynia: Secondary | ICD-10-CM | POA: Insufficient documentation

## 2016-06-10 NOTE — Assessment & Plan Note (Signed)
Cessation advised  Patches recommended

## 2016-06-10 NOTE — Assessment & Plan Note (Signed)
Elevated BP today, hx of high BP Labs per orders Treat at f/u if elevated still

## 2016-06-10 NOTE — Assessment & Plan Note (Signed)
No anemia currently Iron ordered

## 2016-06-10 NOTE — Assessment & Plan Note (Signed)
No active lesions Refilled cellcept Gabapentin and cymbalta for related nerve pains Derm and rheumatology referral

## 2016-06-10 NOTE — Assessment & Plan Note (Signed)
Pap and breast exam at f/u Likely diagnostic mammogram

## 2016-07-07 ENCOUNTER — Ambulatory Visit: Payer: Medicaid Other | Admitting: Family Medicine

## 2016-08-03 MED FILL — DULoxetine HCL 30 MG CPEP: 30 | 30 days supply | Qty: 30 | Fill #1

## 2016-08-03 MED FILL — GABAPENTIN 300 MG CAPSULE: 300 | 33 days supply | Qty: 90 | Fill #1

## 2016-09-30 MED FILL — DULoxetine HCL 30 MG CPEP: 30 | 30 days supply | Qty: 30 | Fill #2

## 2016-09-30 MED FILL — MYCOPHENOLATE 500 MG TABLET: 500 | 30 days supply | Qty: 60 | Fill #1

## 2016-09-30 MED FILL — GABAPENTIN 300 MG CAPSULE: 300 | 33 days supply | Qty: 90 | Fill #2

## 2017-01-04 ENCOUNTER — Encounter: Payer: Self-pay | Admitting: Family Medicine

## 2017-05-30 MED FILL — GABAPENTIN 300 MG CAPSULE: 300 | 30 days supply | Qty: 90 | Fill #3

## 2017-05-30 MED FILL — ?DULOXETINE HCL DR 30 MG CA: 30 MG | 30 days supply | Qty: 30 | Fill #3

## 2018-05-22 ENCOUNTER — Ambulatory Visit: Payer: Self-pay | Admitting: Family Medicine

## 2018-07-03 ENCOUNTER — Ambulatory Visit: Payer: Self-pay | Attending: Family Medicine | Admitting: Licensed Clinical Social Worker

## 2018-07-03 ENCOUNTER — Encounter: Payer: Self-pay | Admitting: Family Medicine

## 2018-07-03 ENCOUNTER — Ambulatory Visit: Payer: Self-pay | Attending: Family Medicine | Admitting: Family Medicine

## 2018-07-03 VITALS — BP 148/89 | HR 86 | Resp 16 | Ht 65.5 in | Wt 160.8 lb

## 2018-07-03 DIAGNOSIS — F419 Anxiety disorder, unspecified: Secondary | ICD-10-CM

## 2018-07-03 DIAGNOSIS — Z91030 Bee allergy status: Secondary | ICD-10-CM | POA: Insufficient documentation

## 2018-07-03 DIAGNOSIS — Z91040 Latex allergy status: Secondary | ICD-10-CM | POA: Insufficient documentation

## 2018-07-03 DIAGNOSIS — F17210 Nicotine dependence, cigarettes, uncomplicated: Secondary | ICD-10-CM | POA: Insufficient documentation

## 2018-07-03 DIAGNOSIS — K219 Gastro-esophageal reflux disease without esophagitis: Secondary | ICD-10-CM

## 2018-07-03 DIAGNOSIS — I10 Essential (primary) hypertension: Secondary | ICD-10-CM

## 2018-07-03 DIAGNOSIS — Z91041 Radiographic dye allergy status: Secondary | ICD-10-CM | POA: Insufficient documentation

## 2018-07-03 DIAGNOSIS — M2550 Pain in unspecified joint: Secondary | ICD-10-CM

## 2018-07-03 DIAGNOSIS — Z881 Allergy status to other antibiotic agents status: Secondary | ICD-10-CM | POA: Insufficient documentation

## 2018-07-03 DIAGNOSIS — Z833 Family history of diabetes mellitus: Secondary | ICD-10-CM | POA: Insufficient documentation

## 2018-07-03 DIAGNOSIS — F32A Depression, unspecified: Secondary | ICD-10-CM

## 2018-07-03 DIAGNOSIS — Z599 Problem related to housing and economic circumstances, unspecified: Secondary | ICD-10-CM

## 2018-07-03 DIAGNOSIS — Z880 Allergy status to penicillin: Secondary | ICD-10-CM | POA: Insufficient documentation

## 2018-07-03 DIAGNOSIS — L88 Pyoderma gangrenosum: Secondary | ICD-10-CM

## 2018-07-03 DIAGNOSIS — Z598 Other problems related to housing and economic circumstances: Secondary | ICD-10-CM

## 2018-07-03 DIAGNOSIS — G8929 Other chronic pain: Secondary | ICD-10-CM

## 2018-07-03 DIAGNOSIS — Z9889 Other specified postprocedural states: Secondary | ICD-10-CM | POA: Insufficient documentation

## 2018-07-03 DIAGNOSIS — F329 Major depressive disorder, single episode, unspecified: Secondary | ICD-10-CM

## 2018-07-03 DIAGNOSIS — Z79899 Other long term (current) drug therapy: Secondary | ICD-10-CM | POA: Insufficient documentation

## 2018-07-03 DIAGNOSIS — F17200 Nicotine dependence, unspecified, uncomplicated: Secondary | ICD-10-CM

## 2018-07-03 DIAGNOSIS — Z8249 Family history of ischemic heart disease and other diseases of the circulatory system: Secondary | ICD-10-CM | POA: Insufficient documentation

## 2018-07-03 MED ORDER — CLOBETASOL PROPIONATE 0.05 % EX CREA: 1.0000 "application " | TOPICAL_CREAM | Freq: Two times a day (BID) | CUTANEOUS | 2 refills | Status: DC

## 2018-07-03 MED ORDER — DULOXETINE HCL 30 MG PO CPEP: 30.0000 mg | ORAL_CAPSULE | Freq: Two times a day (BID) | ORAL | 4 refills | Status: DC

## 2018-07-03 MED ORDER — AMLODIPINE BESYLATE 5 MG PO TABS: 5.0000 mg | ORAL_TABLET | Freq: Every day | ORAL | 4 refills | Status: DC

## 2018-07-03 MED ORDER — PREDNISONE 20 MG PO TABS: ORAL_TABLET | ORAL | 0 refills | Status: DC

## 2018-07-03 MED ORDER — DICLOFENAC SODIUM 1 % TD GEL: 4.0000 g | Freq: Four times a day (QID) | TRANSDERMAL | 6 refills | Status: AC

## 2018-07-03 MED ORDER — ESOMEPRAZOLE MAGNESIUM 40 MG PO CPDR: DELAYED_RELEASE_CAPSULE | ORAL | 4 refills | Status: DC

## 2018-07-03 MED ORDER — GABAPENTIN 300 MG PO CAPS: ORAL_CAPSULE | ORAL | 11 refills | Status: AC

## 2018-07-03 MED FILL — DICLOFENAC SODIUM 1% GEL: 1 | 6 days supply | Qty: 2 | Fill #0

## 2018-07-03 MED FILL — CLOBETASOL PROPIONATE 0.05: 0.05 | 7 days supply | Qty: 30 | Fill #0

## 2018-07-03 MED FILL — AMLODIPINE BESYLATE 5 MG TA: 5 | 30 days supply | Qty: 30 | Fill #0

## 2018-07-03 MED FILL — DULoxetine HCL 30 MG CPEP: 30 | 30 days supply | Qty: 60 | Fill #0

## 2018-07-03 MED FILL — predniSONE 20 MG TABS: 20 | 5 days supply | Qty: 10 | Fill #0

## 2018-07-03 MED FILL — GABAPENTIN 300 MG CAPSULE: 300 | 33 days supply | Qty: 90 | Fill #0

## 2018-07-03 NOTE — Progress Notes (Signed)
Subjective:    Patient ID: Debra Walter, female    DOB: 1971/05/30, 46 y.o.   MRN: [id number]  HPI 47 yo female who presents to reestablish care. Patient was last seen in the office on 06/07/16.  Patient with complaint of multiple issues at today's visit.  Patient states that she is having a lot of acute anxiety and depression.  Patient reports that her brother had surgery for repair of an abdominal aortic aneurysm and after the surgery, patient was paralyzed.  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  Patient also with complaint of a history of an autoimmune disorder, pyoderma gangrenosa and patient reports that she has had no recent flareup with some ulcerations on her fingers.  Patient also reports severe arthritis pain especially in her hands, elbows, knees, ankles as well as her low back.  Patient reports that she can no longer extend her elbows and they stay in a slightly bent position.  Patient states that she has not been able to see her rheumatologist in a few years.  Patient would like to be on a short course of prednisone for her current flareup.  Patient would also like a refill for steroid cream that she had in the past.  Patient reports that in the past she was also on duloxetine to help with joint pain as well as her mood and patient would like to be back on this medication as well.  Patient states that her joint pain ranges between a 5 and a 12 on a 0-to-10 scale with 10 being the worst pain imaginable.  Patient states that most of the time her pain is a constant, dull aching sensation but become sharp with movements or attempts at movement.  Patient reports that she now has difficulty using her hands to pick up or lift objects due to some deformities of her hands and fingers from the autoimmune condition.  Patient also has some recurrent numbness and tingling in her fingertips  and patient would like a new prescription for the gabapentin that she took in the past.  Patient would also like to have something to help with the joint pain.      Patient states that she has had some headaches and has felt as if her blood pressure has been elevated.  Patient states that she was on blood pressure medicine in the past but this was several years ago.  Patient believes that with her increased stress, her blood pressure has likely been elevated.  Patient reports that she does smoke about a pack a day of cigarettes but due to her recent stress she is likely smoking more.  Patient denies any alcohol use.  Patient is not currently working.  Patient reports family history of brother with aortic abdominal aneurysm.  Patient states that she has had an ultrasound done as well and does not have this condition.  Patient reports that she also has a family history of her father with heart disease and hypertension in her mother with diabetes.  Patient reports that she was on cholesterol medication in the past but has not taken medication for her cholesterol recently.  Patient also reports that she is having acid reflux symptoms and would like to be on Nexium which she took in the past.  Patient is having some occasional burning epigastric discomfort as well as\fluid into her mouth and throat. Past Medical  History:  Diagnosis Date  . Anemia   . 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 KNEE SURGERY    . TUBAL LIGATION     Family History  Problem Relation Age of Onset  . Hypertension Mother   . Heart disease Father   . Hypertension Father   . Cancer Maternal Aunt   . Cancer Maternal Aunt    Social History   Tobacco Use  . Smoking status: Heavy Tobacco Smoker    Packs/day: 1.00  . Smokeless tobacco: Never Used  Substance Use Topics  . Alcohol use: No  . Drug use: No   Allergies  Allergen Reactions  . Penicillins Anaphylaxis  . Augmentin  [Amoxicillin-Pot Clavulanate]   . Bee Venom   . Blue Dyes (Parenteral)   . Ciprofloxacin   . Latex       Review of Systems  Constitutional: Positive for fatigue. Negative for chills and fever.  HENT: Negative for dental problem, sore throat and trouble swallowing.   Respiratory: Negative for cough and shortness of breath.   Cardiovascular: Negative for chest pain, palpitations and leg swelling.  Gastrointestinal: Positive for nausea. Negative for abdominal pain, blood in stool, constipation and diarrhea.  Endocrine: Negative for polydipsia, polyphagia and polyuria.  Genitourinary: Negative for dysuria and frequency.  Musculoskeletal: Positive for arthralgias, back pain, joint swelling and myalgias. Negative for gait problem.  Neurological: Positive for headaches. Negative for dizziness.  Psychiatric/Behavioral: Positive for sleep disturbance. Negative for self-injury and suicidal ideas. The patient is nervous/anxious.        Objective:   Physical Exam BP (!) 148/89 (BP Location: Right Arm, Patient Position: Sitting, Cuff Size: Normal)   Pulse 86   Resp 16   Ht 5' 5.5" (1.664 m)   Wt 160 lb 12.8 oz (72.9 kg)   BMI 26.35 kg/m Nurse's notes and vital signs reviewed General- well-nourished, well-developed female in no acute distress.  Patient does smell of cigarette smoke ENT- TMs dull, nares with mild edema, patient with mild posterior pharynx erythema Neck-supple, no lymphadenopathy, no thyromegaly, no carotid bruit Lungs- clear to auscultation bilaterally Cardiovascular-regular rate and rhythm Abdomen-soft, mild epigastric discomfort to palpation, no rebound or guarding Back-no CVA tenderness, patient with mild lumbosacral discomfort to palpation Extremities- no lower extremity edema Musculoskeletal- patient with swelling and increased warmth on the dorsum of the elbows and patient with inability to extend the elbows which remain in a slightly bent position.  Patient also has  increased warmth at the wrist bilaterally and hands.  Patient with mild edema of the hands bilaterally right greater than left and patient with a contracture of the right index finger with an ulceration on the PIP joint and patient with ulceration on the palmar tip of the left middle finger.  Patient with discomfort to palpation over the knees and ankles and decreased ability to fully lift her arms over her head due to shoulder discomfort. Skin- patient with hypertrophic scar type tissue over the hands and elbows as well as lower legs which she states is related to prior flareups of pyoderma gangrenosum.  Patient also with an area on the right upper pubis where she appears to have had a ruptured boil.  Patient states that she did have drainage from the area yesterday and the area is now nontender. Psych- patient exhibits normal judgment but appears anxious and sometimes speaks rapidly       Assessment & Plan:  1. Essential hypertension Patient with  elevated blood pressure today's visit and patient reports past history of hypertension with need for medication.  Patient will be placed on amlodipine which may help decrease ulcerations of the fingers associated with her pyoderma gangrenosum. - amLODipine (NORVASC) 5 MG tablet; Take 1 tablet (5 mg total) by mouth daily. To lower blood pressure  Dispense: 30 tablet; Refill: 4  2. Pyoderma gangrenosum Patient will be placed on Cymbalta 30 mg initially taking twice daily and then if she tolerates this well and her next visit this will likely be switched to 60 mg once daily.  This medication can help with pain as well as depression.  Patient will also be placed on a short taper of prednisone as she states that this is worked well in the past to help with acute flareups of joint pain and swelling/skin changes associated with the pyoderma gangrenosum.  Patient is also provided with refill of steroid cream.  Patient given prescription for Voltaren gel to use as  needed for joint pain as patient is having issues with reflux and cannot tolerate nonsteroidal anti-inflammatories at this time by mouth. - DULoxetine (CYMBALTA) 30 MG capsule; Take 1 capsule (30 mg total) by mouth 2 (two) times daily.  Dispense: 30 capsule; Refill: 4 - predniSONE (DELTASONE) 20 MG tablet; 2 pills daily  Dispense: 10 tablet; Refill: 0 - clobetasol cream (TEMOVATE) 0.05 %; Apply 1 application topically 2 (two) times daily. As needed  Dispense: 30 g; Refill: 2 - gabapentin (NEURONTIN) 300 MG capsule; Take 300 mg at night for 3 days, then twice daily for 3 days, then three times daily  Dispense: 90 capsule; Refill: 11  3. Anxiety and depression Patient with complaint of anxiety and depression secondary to the recent paralysis of her brother for whom she is now the primary caregiver.  Patient is being restarted on Cymbalta which she has taken in the past as patient states that this medication did help with her pain and her mood in the past.  Patient also receives social work consultation at today's visit for resources regarding her anxiety and depression.  Patient will return to clinic in approximately 2 weeks for further evaluation.  Patient is also encouraged to take over-the-counter medication such as melatonin to help with sleep as improvement in sleep may also help with her mood. - DULoxetine (CYMBALTA) 30 MG capsule; Take 1 capsule (30 mg total) by mouth 2 (two) times daily.  Dispense: 30 capsule; Refill: 4  4. Gastroesophageal reflux disease, esophagitis presence not specified Patient reports recent increase in acid reflux symptoms and patient is given new prescription for generic Nexium which helped in the past.  Patient is encouraged to try and avoid use of nonsteroidal anti-inflammatories as well as known trigger foods and avoidance of late night eating to help with her reflux symptoms - esomeprazole (NEXIUM) 40 MG capsule; 1 pill by mouth once daily to lower stomach acid   Dispense: 30 capsule; Refill: 4  5. Tobacco dependence Encourage patient to try and decrease her tobacco use despite her current anxiety and to make a long-term goal of setting a quit date for complete smoking cessation.  Patient was made aware that resources are available including the [place] quit line and clinic/clinical pharmacy visit to discuss medications to help with smoking cessation  6. Chronic pain of multiple joints Patient will restart the use of duloxetine to help with anxiety/depression as well as joint pain.  Patient also given prescription for Voltaren gel.  Patient also given a short-term  course of prednisone to help with acute pain and inflammation.  Neurontin also refilled. - DULoxetine (CYMBALTA) 30 MG capsule; Take 1 capsule (30 mg total) by mouth 2 (two) times daily.  Dispense: 30 capsule; Refill: 4 - diclofenac sodium (VOLTAREN) 1 % GEL; Apply 4 g topically 4 (four) times daily. To painful joints  Dispense: 2 Tube; Refill: 6 - gabapentin (NEURONTIN) 300 MG capsule; Take 300 mg at night for 3 days, then twice daily for 3 days, then three times daily  Dispense: 90 capsule; Refill: 11  *Influenza immunization was offered but declined by the patient at today's visit  An After Visit Summary was printed and given to the patient.  Return in about 2 weeks (around 07/17/2018) for multiple issues.

## 2018-07-06 NOTE — BH Specialist Note (Signed)
LCSWA received consult from PCP to address pt's financial strain. Pt reported that she does not have the financial means to afford her medication that PCP has prescribed.   LCSWA discussed importance of applying for financial counseling for the orange and blue card. 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 She shared she will have money next week.   LCSWA consulted with Pharmacy to inquire if pt has had her one-time free refill. LCSWA was informed that the free fill was provided on 06/10/2016. Information was provided to pt and she was strongly encouraged to follow up with financial counseling.   Jenel LucksJasmine Clell Trahan, LCSWA 07/06/18 4:03 PM

## 2018-07-17 ENCOUNTER — Ambulatory Visit: Payer: Self-pay | Admitting: Family Medicine

## 2019-07-11 ENCOUNTER — Ambulatory Visit: Payer: Self-pay | Admitting: Pharmacist

## 2020-08-06 ENCOUNTER — Telehealth: Payer: Self-pay

## 2020-08-06 NOTE — Telephone Encounter (Signed)
Pt was called and VM is currently full.  Pt can reach out to [facility] and set an appointment [phone]

## 2020-08-06 NOTE — Telephone Encounter (Signed)
Copied from CRM [id number]. Topic: General - Other >> Aug 04, 2020 10:28 AM Jaquita Rector A wrote: Reason for CRM: Patient called in stated that someone called her to schedule a mammogram. She can be reached at  Ph# [phone]  Looked in patients chart, Last visit was with Fulp did not see any documentation of call about mammogram. Wasn't sure who to route message to. Please follow up.

## 2021-03-12 ENCOUNTER — Emergency Department (HOSPITAL_BASED_OUTPATIENT_CLINIC_OR_DEPARTMENT_OTHER)
Admission: EM | Admit: 2021-03-12 | Discharge: 2021-03-12 | Disposition: A | Discharge status: Home / Self Care | Payer: Self-pay | Attending: Emergency Medicine | Admitting: Emergency Medicine

## 2021-03-12 ENCOUNTER — Other Ambulatory Visit: Payer: Self-pay

## 2021-03-12 ENCOUNTER — Encounter (HOSPITAL_BASED_OUTPATIENT_CLINIC_OR_DEPARTMENT_OTHER): Payer: Self-pay

## 2021-03-12 DIAGNOSIS — Z5189 Encounter for other specified aftercare: Secondary | ICD-10-CM

## 2021-03-12 DIAGNOSIS — L88 Pyoderma gangrenosum: Secondary | ICD-10-CM | POA: Insufficient documentation

## 2021-03-12 DIAGNOSIS — Z91040 Latex allergy status: Secondary | ICD-10-CM | POA: Insufficient documentation

## 2021-03-12 DIAGNOSIS — F17210 Nicotine dependence, cigarettes, uncomplicated: Secondary | ICD-10-CM | POA: Insufficient documentation

## 2021-03-12 DIAGNOSIS — Z79899 Other long term (current) drug therapy: Secondary | ICD-10-CM | POA: Insufficient documentation

## 2021-03-12 DIAGNOSIS — I10 Essential (primary) hypertension: Secondary | ICD-10-CM | POA: Insufficient documentation

## 2021-03-12 DIAGNOSIS — Z4800 Encounter for change or removal of nonsurgical wound dressing: Secondary | ICD-10-CM | POA: Insufficient documentation

## 2021-03-12 MED ORDER — PREDNISONE 20 MG PO TABS
40.0000 mg | ORAL_TABLET | Freq: Once | ORAL | Status: AC
  Administered 2021-03-12: 40 mg via ORAL
  Filled 2021-03-12: qty 2

## 2021-03-12 MED ORDER — HYDROCODONE-ACETAMINOPHEN 5-325 MG PO TABS: 1.0000 | ORAL_TABLET | Freq: Four times a day (QID) | ORAL | 0 refills | Status: AC | PRN

## 2021-03-12 MED ORDER — PREDNISONE 10 MG PO TABS: ORAL_TABLET | ORAL | 0 refills | Status: AC

## 2021-03-12 MED ORDER — IBUPROFEN 800 MG PO TABS: 800.0000 mg | ORAL_TABLET | Freq: Three times a day (TID) | ORAL | 0 refills | Status: DC | PRN

## 2021-03-12 MED ORDER — KETOROLAC TROMETHAMINE 30 MG/ML IJ SOLN
30.0000 mg | Freq: Once | INTRAMUSCULAR | Status: AC
  Administered 2021-03-12: 30 mg via INTRAMUSCULAR
  Filled 2021-03-12: qty 1

## 2021-03-12 MED ORDER — HYDROCODONE-ACETAMINOPHEN 5-325 MG PO TABS
1.0000 | ORAL_TABLET | Freq: Once | ORAL | Status: AC
  Administered 2021-03-12: 1 via ORAL
  Filled 2021-03-12: qty 1

## 2021-03-12 NOTE — ED Provider Notes (Signed)
[facility] EMERGENCY DEPARTMENT Provider Note   CSN: [id number] Arrival date & time: 03/12/21  4196     History Chief Complaint  Patient presents with   Wound Check    Debra Walter is a 50 y.o. female.  Debra Walter is a 50 y.o. female with a history of pyoderma gangrenosum, hypertension, anemia, who presents to the ED for evaluation of wounds to the bilateral shins, left breast and right hand.  Wounds have been ongoing for few weeks now.  She reports initially they seem to be improving and or closing up but over the past week they have gotten worse and are now more open, but without drainage.  She reports that it has been 4 to 5 years since she has had a bad flare of her pyoderma gangrenosum.  She has been under a lot of stress caring for her brother which she thinks may have caused this flare.  She reports that these always have to be treated with a prolonged course of steroids.  She has not had issues with superimposed bacterial infections previously.  No fevers or chills.  Does report that she has had some green sinus drainage and frontal sinus pressure over the past 3 days.  Reports that she has been caring for these wounds regularly.  Currently does not have someone who follows her for her pyoderma gangrenosum because she lost her health insurance.  No other aggravating relieving factors.  The history is provided by the patient.      Past Medical History:  Diagnosis Date   Anemia    Pyoderma gangrenosum 08/2011    Patient Active Problem List   Diagnosis Date Noted   Breast pain 06/10/2016   Pyoderma gangrenosum 06/07/2016   Iron deficiency anemia 06/07/2016   Hypertension 06/07/2016   Current smoker 06/07/2016    Past Surgical History:  Procedure Laterality Date   KNEE SURGERY     TUBAL LIGATION       OB History   No obstetric history on file.     Family History  Problem Relation Age of Onset   Hypertension Mother    Heart disease Father     Hypertension Father    Cancer Maternal Aunt    Cancer Maternal Aunt     Social History   Tobacco Use   Smoking status: Heavy Smoker    Packs/day: 1.00    Types: Cigarettes   Smokeless tobacco: Never  Vaping Use   Vaping Use: Never used  Substance Use Topics   Alcohol use: No   Drug use: No    Home Medications Prior to Admission medications   Medication Sig Start Date End Date Taking? Authorizing Provider  HYDROcodone-acetaminophen (NORCO) 5-325 MG tablet Take 1 tablet by mouth every 6 (six) hours as needed. 03/12/21  Yes Dartha Lodge, PA-C  ibuprofen (ADVIL) 800 MG tablet Take 1 tablet (800 mg total) by mouth every 8 (eight) hours as needed. 03/12/21  Yes Dartha Lodge, PA-C  predniSONE (DELTASONE) 10 MG tablet Take 4 tablets (40 mg total) by mouth daily for 20 days, THEN 2 tablets (20 mg total) daily for 7 days, THEN 1 tablet (10 mg total) daily for 7 days, THEN 1 tablet (10 mg total) every other day for 7 days. 03/12/21 04/22/21 Yes Dartha Lodge, PA-C  amLODipine (NORVASC) 5 MG tablet Take 1 tablet (5 mg total) by mouth daily. To lower blood pressure 07/03/18   Fulp, Cammie, MD  clobetasol cream (TEMOVATE) 0.05 %  Apply 1 application topically 2 (two) times daily. As needed 07/03/18   Fulp, Cammie, MD  cyanocobalamin 500 MCG tablet Take 500 mcg by mouth daily.    [provider]  diclofenac sodium (VOLTAREN) 1 % GEL Apply 4 g topically 4 (four) times daily. To painful joints 07/03/18   Fulp, Cammie, MD  DULoxetine (CYMBALTA) 30 MG capsule Take 1 capsule (30 mg total) by mouth 2 (two) times daily. 07/03/18   Fulp, Cammie, MD  esomeprazole (NEXIUM) 40 MG capsule 1 pill by mouth once daily to lower stomach acid 07/03/18   Fulp, Cammie, MD  ferrous sulfate 325 (65 FE) MG EC tablet Take 1 tablet (325 mg total) by mouth 3 (three) times daily with meals. Patient not taking: Reported on 07/03/2018 06/07/16   Dessa Phi, MD  gabapentin (NEURONTIN) 300 MG capsule Take 300 mg at  night for 3 days, then twice daily for 3 days, then three times daily 07/03/18   Fulp, Cammie, MD  lidocaine (XYLOCAINE) 5 % ointment Apply 1 application topically 4 (four) times daily as needed.    [provider]  mycophenolate (CELLCEPT) 500 MG tablet Take 1 tablet (500 mg total) by mouth 2 (two) times daily. Patient not taking: Reported on 07/03/2018 06/07/16   Dessa Phi, MD  vitamin C (ASCORBIC ACID) 500 MG tablet Take 500 mg by mouth daily.    [provider]    Allergies    Penicillins, Augmentin [amoxicillin-pot clavulanate], Bee venom, Blue dyes (parenteral), Ciprofloxacin, and Latex  Review of Systems   Review of Systems  Constitutional:  Negative for chills and fever.  Skin:  Positive for wound.  Neurological:  Negative for weakness and numbness.  All other systems reviewed and are negative.  Physical Exam Updated Vital Signs BP (!) 171/101 (BP Location: Right Arm)   Pulse (!) 104   Temp 98.2 F (36.8 C) (Oral)   Resp 18   Ht 5\' 5"  (1.651 m)   Wt 72.7 kg   SpO2 100%   BMI 26.68 kg/m   Physical Exam Vitals and nursing note reviewed.  Constitutional:      General: She is not in acute distress.    Appearance: Normal appearance. She is well-developed. She is not ill-appearing or diaphoretic.  HENT:     Head: Normocephalic and atraumatic.     Mouth/Throat:     Mouth: Mucous membranes are moist.     Pharynx: Oropharynx is clear.  Eyes:     General:        Right eye: No discharge.        Left eye: No discharge.  Pulmonary:     Effort: Pulmonary effort is normal. No respiratory distress.  Musculoskeletal:        General: No deformity.     Cervical back: Neck supple.  Skin:    General: Skin is warm and dry.     Comments: Open wounds with scabbing around the borders to bilateral shins, similar wound noted to the lateral portion of the left breast as well.  There is also a small scabbed wound over the third MCP joint on the right hand.  No  erythema surrounding wounds, no purulent drainage, no surrounding fluctuance.  See photos of wounds on the shin below  Neurological:     Mental Status: She is alert and oriented to person, place, and time.     Coordination: Coordination normal.  Psychiatric:        Mood and Affect: Mood normal.  Behavior: Behavior normal.         ED Results / Procedures / Treatments   Labs (all labs ordered are listed, but only abnormal results are displayed) Labs Reviewed - No data to display  EKG None  Radiology No results found.  Procedures Procedures   Medications Ordered in ED Medications  predniSONE (DELTASONE) tablet 40 mg (40 mg Oral Given 03/12/21 1027)  ketorolac (TORADOL) 30 MG/ML injection 30 mg (30 mg Intramuscular Given 03/12/21 1028)  HYDROcodone-acetaminophen (NORCO/VICODIN) 5-325 MG per tablet 1 tablet (1 tablet Oral Given 03/12/21 1027)    ED Course  I have reviewed the triage vital signs and the nursing notes.  Pertinent labs & imaging results that were available during my care of the patient were reviewed by me and considered in my medical decision making (see chart for details).    MDM Rules/Calculators/A&P                           50 year old female with history of pyoderma gangrenosum presents with wounds to bilateral shins, bilateral portion of the left breast and right hand.  This is the first time she has had a flare of her PG in a few years, likely due to increasing stress.  Wounds appear clean without purulent drainage or surrounding erythema, no palpable fluctuance.  She has always had to be treated with steroids for these has not had issues with superimposed bacterial infections or needing antibiotics previously.  Reports in the past she has been on 40 mg of prednisone for 20 days and then tapered off, will prescribe prednisone for her as well, medications given for pain control and prescribed for home.  Patient has not been following up with a doctor  regularly due to loss of insurance.  Refer to [facility] wellness clinic for close follow-up.  Return precautions discussed.  Patient expresses understanding and agreement with plan.  Discharged home in good condition.   Final Clinical Impression(s) / ED Diagnoses Final diagnoses:  Pyoderma gangrenosum  Visit for wound check    Rx / DC Orders ED Discharge Orders          Ordered    predniSONE (DELTASONE) 10 MG tablet        03/12/21 1126    ibuprofen (ADVIL) 800 MG tablet  Every 8 hours PRN        03/12/21 1126    HYDROcodone-acetaminophen (NORCO) 5-325 MG tablet  Every 6 hours PRN        03/12/21 1126             Dartha Lodge, [place] 03/12/21 1202    Tegeler, Canary Brim, MD 03/12/21 1452

## 2021-03-12 NOTE — ED Notes (Signed)
ED Provider at bedside. 

## 2021-03-12 NOTE — Discharge Instructions (Addendum)
Take steroids as directed.  Use ibuprofen every 8 hours to help with pain, you can use prescribed pain medication as needed for breakthrough pain.  Continue keeping wounds clean and covered.  Please call to schedule close follow-up appointment with community health and wellness clinic.  Return if you develop fevers, worsening wounds or other new or concerning symptoms.

## 2021-03-12 NOTE — ED Triage Notes (Signed)
Pt arrives with c/o wounds to legs, right hand, and left breast. Reports having an auto immune disease that causes her get wounds, states that she needs steroids to heal them. Pt also reports she may need some BP pills today. Additionally c/o green sinus drainage reports she did take a home Covid test that was negative.

## 2021-03-24 ENCOUNTER — Ambulatory Visit (INDEPENDENT_AMBULATORY_CARE_PROVIDER_SITE_OTHER): Payer: Medicaid Other | Admitting: Primary Care

## 2022-10-21 ENCOUNTER — Ambulatory Visit
Admission: EM | Admit: 2022-10-21 | Discharge: 2022-10-21 | Disposition: A | Discharge status: Home / Self Care | Payer: Commercial Managed Care - HMO | Attending: Nurse Practitioner | Admitting: Nurse Practitioner

## 2022-10-21 ENCOUNTER — Ambulatory Visit (INDEPENDENT_AMBULATORY_CARE_PROVIDER_SITE_OTHER): Payer: Commercial Managed Care - HMO

## 2022-10-21 DIAGNOSIS — M545 Low back pain, unspecified: Secondary | ICD-10-CM

## 2022-10-21 DIAGNOSIS — M5416 Radiculopathy, lumbar region: Secondary | ICD-10-CM | POA: Diagnosis not present

## 2022-10-21 DIAGNOSIS — G8929 Other chronic pain: Secondary | ICD-10-CM | POA: Diagnosis not present

## 2022-10-21 MED ORDER — METHYLPREDNISOLONE 4 MG PO TBPK: ORAL_TABLET | ORAL | 0 refills | Status: AC

## 2022-10-21 MED ORDER — IBUPROFEN 800 MG PO TABS: 800.0000 mg | ORAL_TABLET | Freq: Three times a day (TID) | ORAL | 0 refills | Status: AC

## 2022-10-21 NOTE — ED Triage Notes (Signed)
Patient with c/o sciatica flare up. States she has had it off and on for about 6-7 months. Patient also c/o "sinus problems" that she has been dealing with for months.

## 2022-10-21 NOTE — Discharge Instructions (Addendum)
Overall your x-ray is negative for any acute abnormalities. You are having radicular pain We do encourage you to follow up with your primary care provider if your symptoms persist for further evaluation with additional imaging. You have been prescribed a Prednisone Dosepak to take as directed. You have also requested IBU 800 mg TID prn as directed. This is not need while using the steroid. Continue with the gabapentin 300 mg this helps with the radicular pain.

## 2022-10-21 NOTE — ED Provider Notes (Signed)
[facility]    CSN: [id number] Arrival date & time: 10/21/22  [id number]      History   Chief Complaint Chief Complaint  Patient presents with  . Sciatica    HPI Debra Walter is a 52 y.o. female.   HPI  She is in today with sciatic flare up. She reports that 6 months ago she assisted with caring for her brother who is paralyzed. She has been having pain on and off since then. She is having pain that radiates from her low back into her foot. The pain radiates down the back of the leg with some intermittent numbness, tingling and burning pain. She denies any change in bowel or bladder. She has taken 6 IBU on last evening to help with her pain.  Past Medical History:  Diagnosis Date  . Anemia   . 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 Breast pain 06/10/2016  . 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 Iron deficiency anemia 06/07/2016  . Hypertension 06/07/2016  . Current smoker 06/07/2016    Past Surgical History:  Procedure Laterality Date  . KNEE SURGERY    . TUBAL LIGATION      OB History   No obstetric history on file.      Home Medications    Prior to Admission medications   Medication Sig Start Date End Date Taking? Authorizing Provider  diclofenac sodium (VOLTAREN) 1 % GEL Apply 4 g topically 4 (four) times daily. To painful joints 07/03/18   Fulp, Cammie, MD  gabapentin (NEURONTIN) 300 MG capsule Take 300 mg at night for 3 days, then twice daily for 3 days, then three times daily 07/03/18   Fulp, Cammie, MD  HYDROcodone-acetaminophen (NORCO) 5-325 MG tablet Take 1 tablet by mouth every 6 (six) hours as needed. 03/12/21   Jacqlyn Larsen, PA-C  ibuprofen (ADVIL) 800 MG tablet Take 1 tablet (800 mg total) by mouth every 8 (eight) hours as needed. 03/12/21   Jacqlyn Larsen, PA-C  lidocaine (XYLOCAINE) 5 % ointment Apply 1 application topically 4 (four) times daily as needed.    [provider]  vitamin C  (ASCORBIC ACID) 500 MG tablet Take 500 mg by mouth daily.    [provider]    Family History Family History  Problem Relation Age of Onset  . Hypertension Mother   . Heart disease Father   . Hypertension Father   . Cancer Maternal Aunt   . Cancer Maternal Aunt     Social History Social History   Tobacco Use  . Smoking status: Heavy Smoker    Packs/day: 1.00    Types: Cigarettes  . Smokeless tobacco: Never  Vaping Use  . Vaping Use: Never used  Substance Use Topics  . Alcohol use: No  . Drug use: No     Allergies   Penicillins, Augmentin [amoxicillin-pot clavulanate], Bee venom, Blue dyes (parenteral), Ciprofloxacin, and Latex   Review of Systems Review of Systems   Physical Exam Triage Vital Signs ED Triage Vitals [10/21/22 0931]  Enc Vitals Group     BP (!) 175/61     Pulse Rate 95     Resp 20     Temp 98.2 F (36.8 C)     Temp Source Oral     SpO2 97 %     Weight      Height      Head Circumference      Peak  Flow      Pain Score 9     Pain Loc      Pain Edu?      Excl. in [place]?    No data found.  Updated Vital Signs BP (!) 175/61 (BP Location: Left Arm)   Pulse 95   Temp 98.2 F (36.8 C) (Oral)   Resp 20   SpO2 97%   Visual Acuity Right Eye Distance:   Left Eye Distance:   Bilateral Distance:    Right Eye Near:   Left Eye Near:    Bilateral Near:     Physical Exam Constitutional:      General: She is not in acute distress.    Appearance: She is normal weight. She is not ill-appearing.  HENT:     Head: Normocephalic.  Cardiovascular:     Rate and Rhythm: Normal rate.     Pulses: Normal pulses.  Pulmonary:     Effort: Pulmonary effort is normal.  Musculoskeletal:     Cervical back: Normal and normal range of motion.     Thoracic back: Normal.     Lumbar back: No swelling, tenderness or bony tenderness. Negative right straight leg raise test and negative left straight leg raise test.     Comments: Figure 4 questionable  positivity  Skin:    General: Skin is warm and dry.     Capillary Refill: Capillary refill takes less than 2 seconds.  Neurological:     General: No focal deficit present.     Mental Status: She is alert and oriented to person, place, and time.  Psychiatric:        Mood and Affect: Mood normal.        Behavior: Behavior normal.    UC Treatments / Results  Labs (all labs ordered are listed, but only abnormal results are displayed) Labs Reviewed - No data to display  EKG   Radiology No results found.  Procedures Procedures (including critical care time)  Medications Ordered in UC Medications - No data to display  Initial Impression / Assessment and Plan / UC Course  I have reviewed the triage vital signs and the nursing notes.  Pertinent labs & imaging results that were available during my care of the patient were reviewed by me and considered in my medical decision making (see chart for details).     Back pain  Final Clinical Impressions(s) / UC Diagnoses   Final diagnoses:  Lumbar radiculopathy   Discharge Instructions   None    ED Prescriptions   None    PDMP not reviewed this encounter.   Dionisio David [place], [place] 10/21/22 [phone]
